# Patient Record
Sex: Female | Born: 1940 | Race: White | Hispanic: No | Marital: Single | Smoking: Never smoker
Health system: Southern US, Community
[De-identification: ages and names within clinical notes are randomized; demographics above are authoritative.]

## PROBLEM LIST (undated history)

## (undated) DIAGNOSIS — R259 Unspecified abnormal involuntary movements: Secondary | ICD-10-CM

## (undated) DIAGNOSIS — M1990 Unspecified osteoarthritis, unspecified site: Secondary | ICD-10-CM

## (undated) DIAGNOSIS — F329 Major depressive disorder, single episode, unspecified: Secondary | ICD-10-CM

## (undated) DIAGNOSIS — K219 Gastro-esophageal reflux disease without esophagitis: Secondary | ICD-10-CM

## (undated) DIAGNOSIS — I10 Essential (primary) hypertension: Secondary | ICD-10-CM

## (undated) DIAGNOSIS — G2581 Restless legs syndrome: Secondary | ICD-10-CM

## (undated) DIAGNOSIS — G20A1 Parkinson's disease without dyskinesia, without mention of fluctuations: Secondary | ICD-10-CM

## (undated) DIAGNOSIS — G252 Other specified forms of tremor: Secondary | ICD-10-CM

## (undated) DIAGNOSIS — K5790 Diverticulosis of intestine, part unspecified, without perforation or abscess without bleeding: Secondary | ICD-10-CM

## (undated) DIAGNOSIS — G20 Parkinson's disease: Secondary | ICD-10-CM

## (undated) DIAGNOSIS — F32A Depression, unspecified: Secondary | ICD-10-CM

## (undated) DIAGNOSIS — M353 Polymyalgia rheumatica: Secondary | ICD-10-CM

## (undated) DIAGNOSIS — E785 Hyperlipidemia, unspecified: Secondary | ICD-10-CM

## (undated) DIAGNOSIS — E669 Obesity, unspecified: Secondary | ICD-10-CM

## (undated) HISTORY — DX: Parkinson's disease without dyskinesia, without mention of fluctuations: G20.A1

## (undated) HISTORY — DX: Restless legs syndrome: G25.81

## (undated) HISTORY — DX: Hyperlipidemia, unspecified: E78.5

## (undated) HISTORY — DX: Essential (primary) hypertension: I10

## (undated) HISTORY — DX: Parkinson's disease: G20

## (undated) HISTORY — PX: SALPINGOOPHORECTOMY: SHX82

## (undated) HISTORY — DX: Gastro-esophageal reflux disease without esophagitis: K21.9

## (undated) HISTORY — PX: TONSILLECTOMY AND ADENOIDECTOMY: SHX28

## (undated) HISTORY — DX: Polymyalgia rheumatica: M35.3

## (undated) HISTORY — DX: Unspecified osteoarthritis, unspecified site: M19.90

## (undated) HISTORY — PX: ROTATOR CUFF REPAIR: SHX139

## (undated) HISTORY — DX: Unspecified abnormal involuntary movements: R25.9

## (undated) HISTORY — PX: CHOLECYSTECTOMY: SHX55

## (undated) HISTORY — DX: Major depressive disorder, single episode, unspecified: F32.9

## (undated) HISTORY — DX: Obesity, unspecified: E66.9

## (undated) HISTORY — PX: VAGINAL HYSTERECTOMY: SUR661

## (undated) HISTORY — DX: Other specified forms of tremor: G25.2

## (undated) HISTORY — DX: Diverticulosis of intestine, part unspecified, without perforation or abscess without bleeding: K57.90

## (undated) HISTORY — DX: Depression, unspecified: F32.A

---

## 1999-01-02 ENCOUNTER — Emergency Department (HOSPITAL_COMMUNITY): Admission: EM | Admit: 1999-01-02 | Discharge: 1999-01-02 | Payer: Self-pay | Admitting: Emergency Medicine

## 1999-01-02 ENCOUNTER — Encounter: Payer: Self-pay | Admitting: Emergency Medicine

## 2002-03-05 ENCOUNTER — Encounter: Payer: Self-pay | Admitting: Gastroenterology

## 2002-04-19 ENCOUNTER — Encounter: Payer: Self-pay | Admitting: Orthopedic Surgery

## 2002-04-23 ENCOUNTER — Observation Stay (HOSPITAL_COMMUNITY): Admission: RE | Admit: 2002-04-23 | Discharge: 2002-04-24 | Payer: Self-pay | Admitting: Orthopedic Surgery

## 2004-03-12 ENCOUNTER — Emergency Department (HOSPITAL_COMMUNITY): Admission: AD | Admit: 2004-03-12 | Discharge: 2004-03-12 | Payer: Self-pay | Admitting: Family Medicine

## 2005-02-12 ENCOUNTER — Ambulatory Visit: Payer: Self-pay | Admitting: Family Medicine

## 2005-02-22 ENCOUNTER — Ambulatory Visit: Payer: Self-pay | Admitting: Family Medicine

## 2005-02-25 ENCOUNTER — Ambulatory Visit: Payer: Self-pay

## 2005-02-27 ENCOUNTER — Ambulatory Visit: Payer: Self-pay | Admitting: *Deleted

## 2005-02-27 ENCOUNTER — Ambulatory Visit (HOSPITAL_COMMUNITY): Admission: RE | Admit: 2005-02-27 | Discharge: 2005-02-27 | Payer: Self-pay | Admitting: *Deleted

## 2005-03-06 ENCOUNTER — Ambulatory Visit: Payer: Self-pay | Admitting: Internal Medicine

## 2005-03-12 ENCOUNTER — Ambulatory Visit: Payer: Self-pay | Admitting: Cardiology

## 2005-03-18 ENCOUNTER — Ambulatory Visit: Payer: Self-pay | Admitting: Family Medicine

## 2005-05-07 ENCOUNTER — Ambulatory Visit: Payer: Self-pay | Admitting: Family Medicine

## 2005-08-20 ENCOUNTER — Ambulatory Visit: Payer: Self-pay | Admitting: Family Medicine

## 2005-10-09 ENCOUNTER — Ambulatory Visit: Payer: Self-pay | Admitting: Family Medicine

## 2005-12-05 ENCOUNTER — Ambulatory Visit: Payer: Self-pay | Admitting: Family Medicine

## 2005-12-09 HISTORY — PX: REPLACEMENT TOTAL KNEE: SUR1224

## 2006-06-17 ENCOUNTER — Ambulatory Visit: Payer: Self-pay | Admitting: Family Medicine

## 2006-11-17 ENCOUNTER — Ambulatory Visit: Payer: Self-pay | Admitting: Family Medicine

## 2006-11-20 ENCOUNTER — Ambulatory Visit: Payer: Self-pay | Admitting: Family Medicine

## 2006-11-20 LAB — CONVERTED CEMR LAB
ALT: 24 units/L (ref 0–40)
AST: 26 units/L (ref 0–37)
Albumin: 3.9 g/dL (ref 3.5–5.2)
Alkaline Phosphatase: 93 units/L (ref 39–117)
BUN: 19 mg/dL (ref 6–23)
Basophils Absolute: 0 10*3/uL (ref 0.0–0.1)
Basophils Relative: 0.5 % (ref 0.0–1.0)
CO2: 29 meq/L (ref 19–32)
Calcium: 9.1 mg/dL (ref 8.4–10.5)
Chloride: 106 meq/L (ref 96–112)
Chol/HDL Ratio, serum: 3.1
Cholesterol: 201 mg/dL (ref 0–200)
Creatinine, Ser: 0.8 mg/dL (ref 0.4–1.2)
Eosinophil percent: 2.1 % (ref 0.0–5.0)
GFR calc non Af Amer: 76 mL/min
Glomerular Filtration Rate, Af Am: 92 mL/min/{1.73_m2}
Glucose, Bld: 124 mg/dL — ABNORMAL HIGH (ref 70–99)
HCT: 37.4 % (ref 36.0–46.0)
HDL: 64.9 mg/dL (ref >39.0)
Hemoglobin: 12.6 g/dL (ref 12.0–15.0)
Hgb A1c MFr Bld: 6.1 % — ABNORMAL HIGH (ref 4.6–6.0)
LDL DIRECT: 119.1 mg/dL
Lymphocytes Relative: 29.2 % (ref 12.0–46.0)
MCHC: 33.6 g/dL (ref 30.0–36.0)
MCV: 89.2 fL (ref 78.0–100.0)
Monocytes Absolute: 0.6 10*3/uL (ref 0.2–0.7)
Monocytes Relative: 8 % (ref 3.0–11.0)
Neutro Abs: 4.6 10*3/uL (ref 1.4–7.7)
Neutrophils Relative %: 60.2 % (ref 43.0–77.0)
Platelets: 294 10*3/uL (ref 150–400)
Potassium: 4 meq/L (ref 3.5–5.1)
RBC: 4.2 M/uL (ref 3.87–5.11)
RDW: 13.4 % (ref 11.5–14.6)
Sodium: 142 meq/L (ref 135–145)
TSH: 0.73 microintl units/mL (ref 0.35–5.50)
Total Bilirubin: 0.7 mg/dL (ref 0.3–1.2)
Total Protein: 7 g/dL (ref 6.0–8.3)
Triglyceride fasting, serum: 97 mg/dL (ref 0–149)
VLDL: 19 mg/dL (ref 0–40)
WBC: 7.6 10*3/uL (ref 4.5–10.5)

## 2007-03-10 HISTORY — PX: KNEE SURGERY: SHX244

## 2007-04-08 ENCOUNTER — Ambulatory Visit (HOSPITAL_BASED_OUTPATIENT_CLINIC_OR_DEPARTMENT_OTHER): Admission: RE | Admit: 2007-04-08 | Discharge: 2007-04-08 | Payer: Self-pay | Admitting: Orthopedic Surgery

## 2007-08-21 DIAGNOSIS — K219 Gastro-esophageal reflux disease without esophagitis: Secondary | ICD-10-CM | POA: Insufficient documentation

## 2007-08-21 DIAGNOSIS — I10 Essential (primary) hypertension: Secondary | ICD-10-CM

## 2007-08-21 DIAGNOSIS — J309 Allergic rhinitis, unspecified: Secondary | ICD-10-CM

## 2007-12-21 ENCOUNTER — Ambulatory Visit: Payer: Self-pay | Admitting: Family Medicine

## 2007-12-21 DIAGNOSIS — M1990 Unspecified osteoarthritis, unspecified site: Secondary | ICD-10-CM | POA: Insufficient documentation

## 2007-12-21 DIAGNOSIS — Z8679 Personal history of other diseases of the circulatory system: Secondary | ICD-10-CM | POA: Insufficient documentation

## 2007-12-21 DIAGNOSIS — F418 Other specified anxiety disorders: Secondary | ICD-10-CM

## 2007-12-23 ENCOUNTER — Encounter: Payer: Self-pay | Admitting: Family Medicine

## 2007-12-23 ENCOUNTER — Ambulatory Visit: Payer: Self-pay | Admitting: Internal Medicine

## 2007-12-30 ENCOUNTER — Telehealth (INDEPENDENT_AMBULATORY_CARE_PROVIDER_SITE_OTHER): Payer: Self-pay | Admitting: *Deleted

## 2008-01-01 ENCOUNTER — Encounter: Payer: Self-pay | Admitting: Family Medicine

## 2008-01-13 ENCOUNTER — Encounter: Payer: Self-pay | Admitting: Family Medicine

## 2008-01-15 ENCOUNTER — Telehealth: Payer: Self-pay | Admitting: Family Medicine

## 2008-01-20 ENCOUNTER — Ambulatory Visit: Payer: Self-pay | Admitting: Family Medicine

## 2008-03-30 ENCOUNTER — Encounter: Payer: Self-pay | Admitting: Family Medicine

## 2008-04-12 ENCOUNTER — Ambulatory Visit: Payer: Self-pay | Admitting: Family Medicine

## 2008-04-12 DIAGNOSIS — M25569 Pain in unspecified knee: Secondary | ICD-10-CM | POA: Insufficient documentation

## 2008-05-06 ENCOUNTER — Inpatient Hospital Stay (HOSPITAL_COMMUNITY): Admission: RE | Admit: 2008-05-06 | Discharge: 2008-05-09 | Payer: Self-pay | Admitting: Orthopedic Surgery

## 2008-08-02 ENCOUNTER — Encounter: Payer: Self-pay | Admitting: Family Medicine

## 2008-09-16 ENCOUNTER — Ambulatory Visit: Payer: Self-pay | Admitting: Family Medicine

## 2008-11-19 ENCOUNTER — Encounter: Payer: Self-pay | Admitting: Family Medicine

## 2009-02-23 ENCOUNTER — Ambulatory Visit: Payer: Self-pay | Admitting: Family Medicine

## 2009-02-24 ENCOUNTER — Telehealth: Payer: Self-pay | Admitting: Family Medicine

## 2009-03-08 ENCOUNTER — Ambulatory Visit: Payer: Self-pay

## 2009-03-08 ENCOUNTER — Encounter: Payer: Self-pay | Admitting: Family Medicine

## 2009-03-13 ENCOUNTER — Ambulatory Visit: Payer: Self-pay | Admitting: Gastroenterology

## 2009-03-20 ENCOUNTER — Telehealth: Payer: Self-pay | Admitting: Gastroenterology

## 2009-03-27 ENCOUNTER — Encounter: Payer: Self-pay | Admitting: Gastroenterology

## 2009-03-27 ENCOUNTER — Ambulatory Visit: Payer: Self-pay | Admitting: Gastroenterology

## 2009-03-27 HISTORY — PX: COLONOSCOPY: SHX174

## 2009-03-29 ENCOUNTER — Encounter: Payer: Self-pay | Admitting: Gastroenterology

## 2009-04-18 ENCOUNTER — Telehealth (INDEPENDENT_AMBULATORY_CARE_PROVIDER_SITE_OTHER): Payer: Self-pay

## 2009-04-19 ENCOUNTER — Ambulatory Visit: Payer: Self-pay | Admitting: Gastroenterology

## 2009-04-19 DIAGNOSIS — K5900 Constipation, unspecified: Secondary | ICD-10-CM | POA: Insufficient documentation

## 2009-06-21 ENCOUNTER — Encounter: Payer: Self-pay | Admitting: Family Medicine

## 2009-06-26 ENCOUNTER — Ambulatory Visit: Payer: Self-pay | Admitting: Gastroenterology

## 2009-06-27 ENCOUNTER — Encounter: Payer: Self-pay | Admitting: Gastroenterology

## 2009-10-04 ENCOUNTER — Encounter (INDEPENDENT_AMBULATORY_CARE_PROVIDER_SITE_OTHER): Payer: Self-pay | Admitting: *Deleted

## 2009-11-07 ENCOUNTER — Ambulatory Visit: Payer: Self-pay | Admitting: Family Medicine

## 2010-01-01 ENCOUNTER — Ambulatory Visit: Payer: Self-pay | Admitting: Family Medicine

## 2010-01-05 LAB — CONVERTED CEMR LAB
ALT: 20 units/L (ref 0–35)
AST: 23 units/L (ref 0–37)
Albumin: 3.9 g/dL (ref 3.5–5.2)
Alkaline Phosphatase: 81 units/L (ref 39–117)
BUN: 17 mg/dL (ref 6–23)
Basophils Absolute: 0.1 10*3/uL (ref 0.0–0.1)
Basophils Relative: 0.9 % (ref 0.0–3.0)
Bilirubin, Direct: 0.1 mg/dL (ref 0.0–0.3)
CO2: 32 meq/L (ref 19–32)
Calcium: 9.3 mg/dL (ref 8.4–10.5)
Chloride: 104 meq/L (ref 96–112)
Cholesterol: 211 mg/dL — ABNORMAL HIGH (ref 0–200)
Creatinine, Ser: 0.8 mg/dL (ref 0.4–1.2)
Direct LDL: 127.5 mg/dL
Eosinophils Absolute: 0.1 10*3/uL (ref 0.0–0.7)
Eosinophils Relative: 1.4 % (ref 0.0–5.0)
GFR calc non Af Amer: 75.4 mL/min (ref >60)
Glucose, Bld: 101 mg/dL — ABNORMAL HIGH (ref 70–99)
HCT: 34.9 % — ABNORMAL LOW (ref 36.0–46.0)
HDL: 66.7 mg/dL (ref >39.00)
Hemoglobin: 11.7 g/dL — ABNORMAL LOW (ref 12.0–15.0)
Lymphocytes Relative: 34.7 % (ref 12.0–46.0)
Lymphs Abs: 2.1 10*3/uL (ref 0.7–4.0)
MCHC: 33.5 g/dL (ref 30.0–36.0)
MCV: 88.3 fL (ref 78.0–100.0)
Monocytes Absolute: 0.4 10*3/uL (ref 0.1–1.0)
Monocytes Relative: 6.7 % (ref 3.0–12.0)
Neutro Abs: 3.3 10*3/uL (ref 1.4–7.7)
Neutrophils Relative %: 56.3 % (ref 43.0–77.0)
Platelets: 215 10*3/uL (ref 150.0–400.0)
Potassium: 4.2 meq/L (ref 3.5–5.1)
RBC: 3.95 M/uL (ref 3.87–5.11)
RDW: 13.3 % (ref 11.5–14.6)
Sodium: 141 meq/L (ref 135–145)
TSH: 0.83 microintl units/mL (ref 0.35–5.50)
Total Bilirubin: 0.8 mg/dL (ref 0.3–1.2)
Total CHOL/HDL Ratio: 3
Total Protein: 6.8 g/dL (ref 6.0–8.3)
Triglycerides: 100 mg/dL (ref 0.0–149.0)
VLDL: 20 mg/dL (ref 0.0–40.0)
Vitamin B-12: 375 pg/mL (ref 211–911)
WBC: 6 10*3/uL (ref 4.5–10.5)

## 2010-01-31 ENCOUNTER — Encounter: Payer: Self-pay | Admitting: Family Medicine

## 2010-03-18 HISTORY — PX: ESOPHAGOGASTRODUODENOSCOPY (EGD) WITH ESOPHAGEAL DILATION: SHX5812

## 2010-10-05 ENCOUNTER — Telehealth: Payer: Self-pay | Admitting: Gastroenterology

## 2010-10-10 ENCOUNTER — Ambulatory Visit: Payer: Self-pay | Admitting: Gastroenterology

## 2010-10-10 DIAGNOSIS — R1319 Other dysphagia: Secondary | ICD-10-CM | POA: Insufficient documentation

## 2010-10-12 ENCOUNTER — Ambulatory Visit: Payer: Self-pay | Admitting: Gastroenterology

## 2010-10-15 ENCOUNTER — Encounter: Payer: Self-pay | Admitting: Gastroenterology

## 2010-10-16 ENCOUNTER — Encounter: Payer: Self-pay | Admitting: Gastroenterology

## 2010-11-14 ENCOUNTER — Encounter: Payer: Self-pay | Admitting: Family Medicine

## 2010-12-03 ENCOUNTER — Ambulatory Visit (HOSPITAL_COMMUNITY)
Admission: RE | Admit: 2010-12-03 | Discharge: 2010-12-03 | Payer: Self-pay | Source: Home / Self Care | Attending: Orthopedic Surgery | Admitting: Orthopedic Surgery

## 2011-01-05 IMAGING — CR DG CHEST 2V
2 series · 2 of 2 positions shown · non-contrast
Comparison: 05/04/2008

CLINICAL DATA: Preadmit knee surgery

CHEST - 2 VIEW

[view not recorded (1 of 2)]
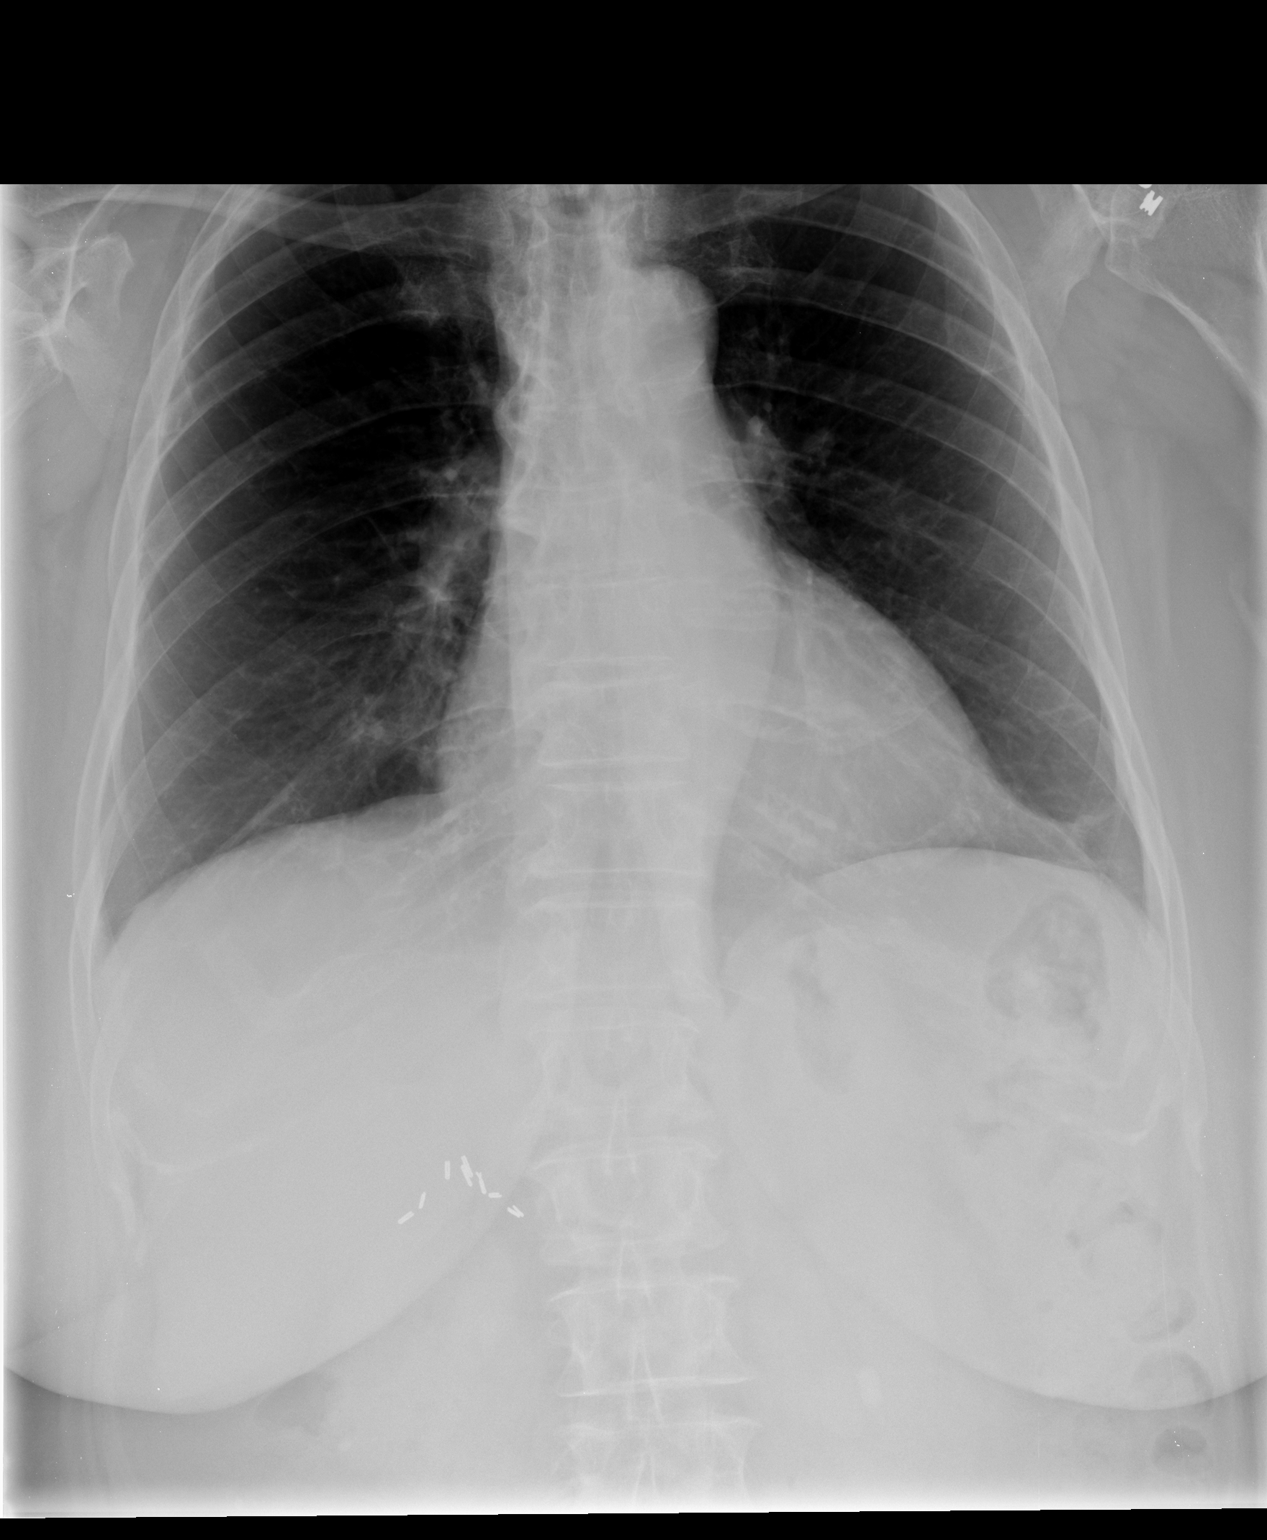

[view not recorded (2 of 2)]
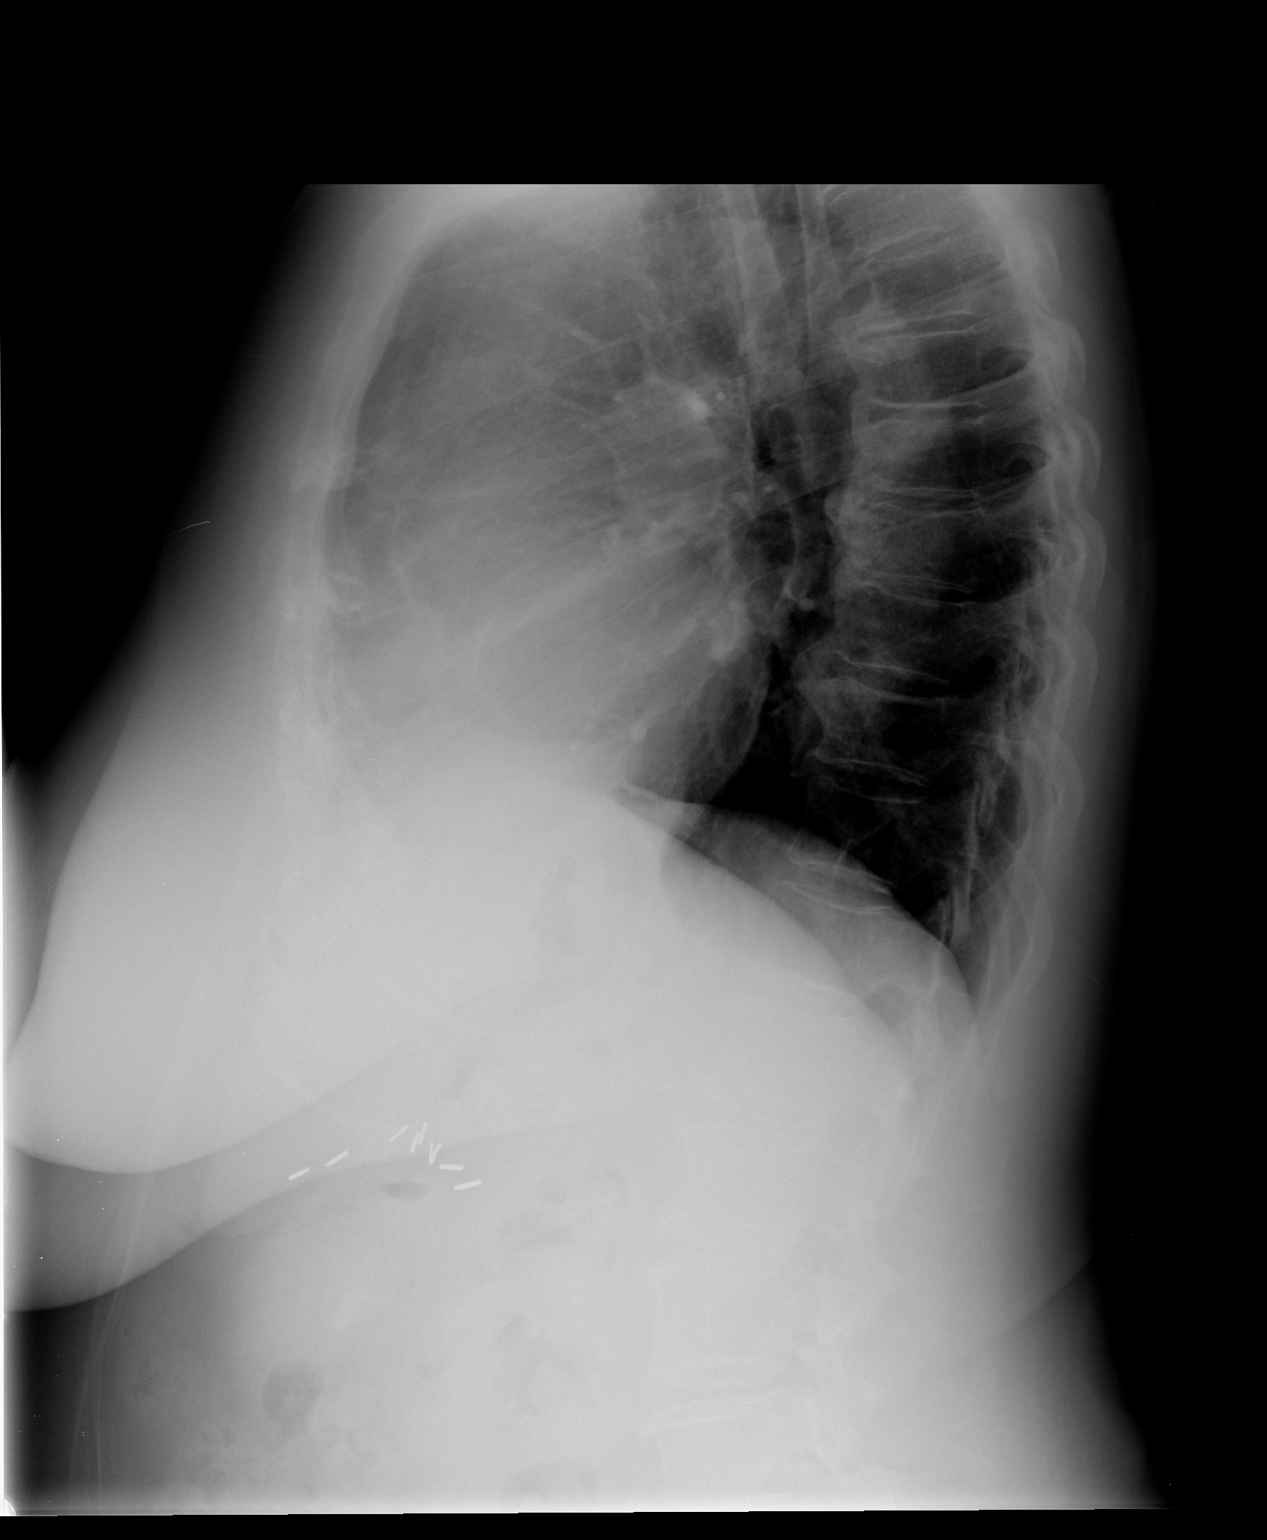

[2 of 2 positions shown; findings below may reference images not displayed]

FINDINGS: Heart size is upper normal.  Negative for heart failure.
Lungs are clear without infiltrate or mass lesion.  Mild scarring
in the lingula.  Mild degenerative change and spurring in the
thoracic spine.
IMPRESSION: No active cardiopulmonary disease.

## 2011-01-08 NOTE — Miscellaneous (Signed)
Summary: Dexilant rx  Clinical Lists Changes  Medications: Rx of DEXILANT 60 MG CPDR (DEXLANSOPRAZOLE) one capsule by mouth once daily;  #30 x 6;  Signed;  Entered by: Harlow Mares CMA (AAMA);  Authorized by: Meryl Dare MD [facility];  Method used: [facility]  [address] [id number] [address], [place], [place]  [address], Ph: [phone] or [phone], Fax: [phone]  Left a message on patients machine to call back if she has questions but her rx has been sent.   Prescriptions: DEXILANT 60 MG CPDR (DEXLANSOPRAZOLE) one capsule by mouth once daily  #30 x 6   Entered by:   Harlow Mares CMA (AAMA)   Authorized by:   Meryl Dare MD [facility]   Signed by:   Harlow Mares CMA (AAMA) on 10/15/2010   Method used:   [facility]        [facility]  [address] [id number]       [address]       [place], [place]  [address]       Ph: [phone] or [phone]       Fax: [phone]   RxID:   [id number]

## 2011-01-08 NOTE — Assessment & Plan Note (Signed)
Summary: pt will come in fasting/njr   Vital Signs:  Patient profile:   70 year old female Menstrual status:  hysterectomy Height:      62.75 inches Weight:      186 pounds Temp:     98.0 degrees F oral Pulse rate:   68 / minute BP sitting:   136 / 82  (right arm)  Vitals Entered By: Alfred Levins, CMA (January 01, 2010 9:58 AM)  Contraindications/Deferment of Procedures/Staging:    Test/Procedure: FLU VAX    Reason for deferment: patient declined  CC: cpx, fasting     Menstrual Status hysterectomy   History of Present Illness: 70 yr old female for cpx. She has a few items to discuss today. First she has frequent nasal stuffiness and PND. No sinus pain or fever or ST. She also has been experiencing occasioanl brief sharp pains or "shocks" in different parts of her body which are new. These occur only a few times a day and do not inhibit her activites at all.   Current Medications (verified): 1)  Lisinopril-Hydrochlorothiazide 20-25 Mg Tabs (Lisinopril-Hydrochlorothiazide) .Marland Kitchen.. 1 By Mouth Once Daily 2)  Cherry Fruit  Extract .... 2 By Mouth Once Daily 3)  Calcet 150-100 Mg-Unit  Tabs (Calcium-Vitamin D) .Marland Kitchen.. 1 By Mouth Once Daily 4)  Fish Oil 500 Mg  Caps (Omega-3 Fatty Acids) .... 2 By Mouth Once Daily 5)  Multivitamins   Tabs (Multiple Vitamin) .Marland Kitchen.. 1 By Mouth Once Daily 6)  Tramadol Hcl 50 Mg Tabs (Tramadol Hcl) .... Take 1 Tabs  Every Morning and 1 Tab Every Evening 7)  Nexium 40 Mg Cpdr (Esomeprazole Magnesium) .... Take 1 Tab Each Morning 8)  Vitamin D 2000 Unit Tabs (Cholecalciferol) .... Once Daily  Allergies (verified): No Known Drug Allergies  Past History:  Past Medical History: Allergic rhinitis GERD, sees Dr. Russella Dar Hypertension Depression Osteoarthritis and polymyalgia rheumatica  per Dr. Corliss Skains Hyperlipidemia Diverticulosis Obesity Sleep Apnea  Past Surgical History: Cholecystectomy Tonsillectomy and adenoidectomy surgery to left knee for torn  ligaments 4-08  per Dr. Sherlean Foot colonoscopy 03-27-09  per Dr. 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 Sherlean Foot Hysterectomy Bilateral salpingo-oophorectomy Rotator Cuff Repair  Family History: Reviewed history from 06/26/2009 and no changes required. Family History of Alcoholism/Addiction Alzheimer's disease mother Family History Hypertension Family History of Stroke M 1st degree relative <50 No FH of Colon Cancer: Family History of Colon Polyps: mother  Social History: Reviewed history from 04/19/2009 and no changes required. Divorced Never Smoked Alcohol use-no Drug use-no Daily Caffeine Use -2 Occupation: PT Retail  Review of Systems  The patient denies anorexia, fever, weight loss, weight gain, vision loss, decreased hearing, hoarseness, chest pain, syncope, dyspnea on exertion, peripheral edema, prolonged cough, headaches, hemoptysis, abdominal pain, melena, hematochezia, severe indigestion/heartburn, hematuria, incontinence, genital sores, muscle weakness, suspicious skin lesions, transient blindness, difficulty walking, depression, unusual weight change, abnormal bleeding, enlarged lymph nodes, angioedema, and breast masses.    Physical Exam  General:  overweight-appearing.   Head:  Normocephalic and atraumatic without obvious abnormalities. No apparent alopecia or balding. Eyes:  No corneal or conjunctival inflammation noted. EOMI. Perrla. Funduscopic exam benign, without hemorrhages, exudates or papilledema. Vision grossly normal. Ears:  External ear exam shows no significant lesions or deformities.  Otoscopic examination reveals clear canals, tympanic membranes are intact bilaterally without bulging, retraction, inflammation or discharge. Hearing is grossly normal bilaterally. Nose:  External nasal examination shows no deformity or inflammation. Nasal mucosa are pink  and moist without lesions or exudates. Mouth:  Oral mucosa and oropharynx without  lesions or exudates.  Teeth in good repair. Neck:  No deformities, masses, or tenderness noted. Chest Wall:  No deformities, masses, or tenderness noted. Breasts:  No mass, nodules, thickening, tenderness, bulging, retraction, inflamation, nipple discharge or skin changes noted.   Lungs:  Normal respiratory effort, chest expands symmetrically. Lungs are clear to auscultation, no crackles or wheezes. Heart:  Normal rate and regular rhythm. S1 and S2 normal without gallop, murmur, click, rub or other extra sounds. EKG normal Abdomen:  Bowel sounds positive,abdomen soft and non-tender without masses, organomegaly or hernias noted. Msk:  No deformity or scoliosis noted of thoracic or lumbar spine.   Pulses:  R and L carotid,radial,femoral,dorsalis pedis and posterior tibial pulses are full and equal bilaterally Extremities:  No clubbing, cyanosis, edema, or deformity noted with normal full range of motion of all joints.   Neurologic:  No cranial nerve deficits noted. Station and gait are normal. Plantar reflexes are down-going bilaterally. DTRs are symmetrical throughout. Sensory, motor and coordinative functions appear intact. Skin:  Intact without suspicious lesions or rashes Cervical Nodes:  No lymphadenopathy noted Axillary Nodes:  No palpable lymphadenopathy Inguinal Nodes:  No significant adenopathy Psych:  Cognition and judgment appear intact. Alert and cooperative with normal attention span and concentration. No apparent delusions, illusions, hallucinations   Impression & Recommendations:  Problem # 1:  HEALTH MAINTENANCE EXAM (ICD-V70.0)  Orders: EKG w/ Interpretation ([address]) UA Dipstick w/o Micro (automated)  (81003) Venipuncture ([address]) TLB-Lipid Panel (80061-LIPID) TLB-BMP (Basic Metabolic Panel-BMET) (80048-METABOL) TLB-CBC Platelet - w/Differential (85025-CBCD) TLB-Hepatic/Liver Function Pnl (80076-HEPATIC) TLB-TSH (Thyroid Stimulating Hormone) (84443-TSH) TLB-B12,  Serum-Total ONLY (60454-U98)  Complete Medication List: 1)  Lisinopril-hydrochlorothiazide 20-25 Mg Tabs (Lisinopril-hydrochlorothiazide) .Marland Kitchen.. 1 by mouth once daily 2)  Cherry Fruit Extract  .... 2 by mouth once daily 3)  Calcet 150-100 Mg-unit Tabs (Calcium-vitamin d) .Marland Kitchen.. 1 by mouth once daily 4)  Fish Oil 500 Mg Caps (Omega-3 fatty acids) .... 2 by mouth once daily 5)  Multivitamins Tabs (Multiple vitamin) .Marland Kitchen.. 1 by mouth once daily 6)  Tramadol Hcl 50 Mg Tabs (Tramadol hcl) .... Take 1 tabs  every morning and 1 tab every evening 7)  Nexium 40 Mg Cpdr (Esomeprazole magnesium) .... Take 1 tab each morning 8)  Vitamin D 2000 Unit Tabs (Cholecalciferol) .... Once daily 9)  Flonase 50 Mcg/act Susp (Fluticasone propionate) .... 2 sprays each nostril once daily  Patient Instructions: 1)  Get labs. 2)  It is important that you exercise reguarly at least 20 minutes 5 times a week. If you develop chest pain, have severe difficulty breathing, or feel very tired, stop exercising immediately and seek medical attention.  3)  You need to lose weight. Consider a lower calorie diet and regular exercise.  4)  Schedule your mammogram.  Prescriptions: FLONASE 50 MCG/ACT SUSP (FLUTICASONE PROPIONATE) 2 sprays each nostril once daily  #30 x 11   Entered and Authorized by:   Nelwyn Salisbury MD   Signed by:   Nelwyn Salisbury MD on 01/01/2010   Method used:   [facility]  [address] [id number]       [address]       [place], [place]  [address]       Ph: [phone] or [phone]       Fax: [phone]   RxID:  [id number] LISINOPRIL-HYDROCHLOROTHIAZIDE 20-25 MG TABS (LISINOPRIL-HYDROCHLOROTHIAZIDE) 1 by mouth once daily  #30 x 11   Entered and Authorized by:   Nelwyn Salisbury MD   Signed by:   Nelwyn Salisbury MD on 01/01/2010   Method used:   [facility]  [address] [id number]       [address]       [place], [place]  [address]       Ph: [phone] or [phone]       Fax: [phone]   RxID:   [id number]   Preventive Care Screening  Mammogram:    Date:  12/10/2007    Results:  normal    Appended Document: pt will come in fasting/njr  Laboratory Results   Urine Tests    Routine Urinalysis   Color: yellow Appearance: Clear Glucose: negative   (Normal Range: Negative) Bilirubin: negative   (Normal Range: Negative) Ketone: negative   (Normal Range: Negative) Spec. Gravity: 1.020   (Normal Range: 1.003-1.035) Blood: negative   (Normal Range: Negative) pH: 7.0   (Normal Range: 5.0-8.0) Protein: negative   (Normal Range: Negative) Urobilinogen: 0.2   (Normal Range: 0-1) Nitrite: negative   (Normal Range: Negative) Leukocyte Esterace: trace   (Normal Range: Negative)    Comments: Rita Ohara  January 01, 2010 1:36 PM

## 2011-01-08 NOTE — Progress Notes (Signed)
Summary: Triage  Phone Note Call from Patient Call back at Home Phone [phone]   Caller: Patient Call For: Dr. Russella Dar Summary of Call: Wakes up nauseated, burning/abd pains, "gurgling sounds" Initial call taken by: Karna Christmas,  October 05, 2010 1:41 PM  Follow-up for Phone Call        Patient c/o nausea, gas, excess belching, abdominal burning.  She tried taking Nexium but this didn't help.  Advised to begin a low gas diet.  She is asked to resume Nexium.  She is asked to try gasa-x or phazyme ac meals.  I have given her 2 boxes of Align and she will try this until her appointment on Wed.  I have provided her written information on gas and a low gas diet. Follow-up by: Darcey Nora RN, CGRN,  October 05, 2010 3:49 PM

## 2011-01-08 NOTE — Assessment & Plan Note (Signed)
Summary: gas/ nausea/ abdominal pain/sheri   History of Present Illness Visit Type: Follow-up Visit Primary GI MD: Elie Goody MD [facility] Primary Roma Bierlein: Gershon Crane, MD Chief Complaint: gas, nausea, dysphagia, epigastirc pain x 1 month History of Present Illness:   Mrs. Wimer, relates problems with epigastric pain, burning chest pain, worsening belching, and nausea. She does not take acid suppressing meds on a regular basis, but started Nexium last month, which improved her symptoms, but she has since discontinued Nexium. She also notes difficulty swallowing solid foods intermittently for the past few months.   GI Review of Systems    Reports abdominal pain, acid reflux, belching, bloating, chest pain, dysphagia with solids, and  nausea.     Location of  Abdominal pain: epigastric area.    Denies dysphagia with liquids, heartburn, loss of appetite, vomiting, vomiting blood, weight loss, and  weight gain.        Denies anal fissure, black tarry stools, change in bowel habit, constipation, diarrhea, diverticulosis, fecal incontinence, heme positive stool, hemorrhoids, irritable bowel syndrome, jaundice, light color stool, liver problems, rectal bleeding, and  rectal pain.   Current Medications (verified): 1)  Lisinopril-Hydrochlorothiazide 20-25 Mg Tabs (Lisinopril-Hydrochlorothiazide) .Marland Kitchen.. 1 By Mouth Once Daily 2)  Cherry Fruit  Extract .... 2 By Mouth Once Daily 3)  Calcet 150-100 Mg-Unit  Tabs (Calcium-Vitamin D) .Marland Kitchen.. 1 By Mouth Once Daily 4)  Fish Oil 500 Mg  Caps (Omega-3 Fatty Acids) .... 2 By Mouth Once Daily 5)  Multivitamins   Tabs (Multiple Vitamin) .Marland Kitchen.. 1 By Mouth Once Daily 6)  Tramadol Hcl 50 Mg Tabs (Tramadol Hcl) .... Take 1 Tabs  Every Morning and 1 Tab Every Evening 7)  Vitamin D 2000 Unit Tabs (Cholecalciferol) .... Once Daily 8)  Flonase 50 Mcg/act Susp (Fluticasone Propionate) .... 2 Sprays Each Nostril Once Daily  Allergies (verified): No Known Drug  Allergies  Past History:  Past Medical History: Reviewed history from 01/01/2010 and no changes required. Allergic rhinitis GERD, sees Dr. Russella Dar Hypertension Depression Osteoarthritis and polymyalgia rheumatica  per Dr. Corliss Skains Hyperlipidemia Diverticulosis Obesity Sleep Apnea  Past Surgical History: Reviewed history from 01/01/2010 and no changes required. Cholecystectomy Tonsillectomy and adenoidectomy surgery to left knee for torn ligaments 4-08  per Dr. Sherlean Foot colonoscopy 03-27-09  per Dr. 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 Sherlean Foot Hysterectomy Bilateral salpingo-oophorectomy Rotator Cuff Repair  Family History: Reviewed history from 06/26/2009 and no changes required. Family History of Alcoholism/Addiction Alzheimer's disease mother Family History Hypertension Family History of Stroke M 1st degree relative <50 No FH of Colon Cancer: Family History of Colon Polyps: mother  Social History: Reviewed history from 04/19/2009 and no changes required. Divorced Never Smoked Alcohol use-no Drug use-no Daily Caffeine Use -2 Occupation: PT Retail  Review of Systems       The patient complains of allergy/sinus, arthritis/joint pain, cough, muscle pains/cramps, sleeping problems, urination - excessive, urine leakage, and voice change.  The patient denies anemia, anxiety-new, back pain, blood in urine, breast changes/lumps, change in vision, confusion, coughing up blood, depression-new, fainting, fatigue, fever, headaches-new, hearing problems, heart murmur, heart rhythm changes, itching, menstrual pain, night sweats, nosebleeds, pregnancy symptoms, shortness of breath, skin rash, sore throat, swelling of feet/legs, swollen lymph glands, thirst - excessive , urination - excessive , urination changes/pain, and vision changes.    Vital Signs:  Patient profile:   70 year old female Menstrual status:  hysterectomy Height:      62.75  inches Weight:      192.13 pounds BMI:     34.43 Pulse rate:   68 / minute Pulse rhythm:   regular BP sitting:   134 / 72  (left arm) Cuff size:   regular  Vitals Entered By: June McMurray CMA Duncan Dull) (October 10, 2010 9:53 AM)  Physical Exam  General:  Well developed, well nourished, no acute distress. Head:  Normocephalic and atraumatic. Eyes:  PERRLA, no icterus. Mouth:  No deformity or lesions, dentition normal. Lungs:  Clear throughout to auscultation. Heart:  Regular rate and rhythm; no murmurs, rubs,  or bruits. Abdomen:  Soft, nontender and nondistended. No masses, hepatosplenomegaly or hernias noted. Normal bowel sounds. Psych:  Alert and cooperative. Normal mood and affect.  Impression & Recommendations:  Problem # 1:  GERD (ICD-530.81) Refractory reflux symptoms and dysphasia. Despite my prior recommendations she has not remained on acid suppressants. Start Dexilant 60 mg q.a.m. and begin on standard antireflux measures. She has not previously had upper endoscopy. Rule out erosive esophagitis, strictures. The risks, benefits and alternatives to endoscopy with possible biopsy and possible dilation were discussed with the patient and they consent to proceed. The procedure will be scheduled electively.  Problem # 2:  DYSPHAGIA (ICD-787.29) As in problem #1.  Problem # 3:  FAMILY HX OF COLONIC POLYPS (ICD-V18.51) Screening colonoscopy September 2015  Other Orders: EGD SAV (EGD SAV)  Patient Instructions: 1)  Start Dexilant samples one capsule by mouth once daily x 1 month. 2)  Avoid foods high in acid content ( tomatoes, citrus juices, spicy foods) . Avoid eating within 3 to 4 hours of lying down or before exercising. Do not over eat; try smaller more frequent meals. Elevate head of bed four inches when sleeping.  3)  Upper Endoscopy with Dilatation brochure given.  4)  Copy sent to : Gershon Crane, MD 5)  The medication list was reviewed and reconciled.  All changed /  newly prescribed medications were explained.  A complete medication list was provided to the patient / caregiver.

## 2011-01-08 NOTE — Letter (Signed)
Summary: EGD Instructions  [facility]  [address] [place], [place] [address]   Phone: [phone]  Fax: [phone]       Leah Hodges    Jul 01, 1941    MRN: [id number]       Procedure Day /Date: Friday November 4th, 2011     Arrival Time:  3:00pm     Procedure Time: 4:00pm     Location of Procedure:                    _ x _ [facility] (4th Floor)    PREPARATION FOR ENDOSCOPY   On 10/12/10 THE DAY OF THE PROCEDURE:  1.   No solid foods, milk or milk products are allowed after midnight the night before your procedure.  2.   Do not drink anything colored red or purple.  Avoid juices with pulp.  No orange juice.  3.  You may drink clear liquids until 2:00pm, which is 2 hours before your procedure.                                                                                                CLEAR LIQUIDS INCLUDE: Water Jello Ice Popsicles Tea (sugar ok, no milk/cream) Powdered fruit flavored drinks Coffee (sugar ok, no milk/cream) Gatorade Juice: apple, white grape, white cranberry  Lemonade Clear bullion, consomm, broth Carbonated beverages (any kind) Strained chicken noodle soup Hard Candy   MEDICATION INSTRUCTIONS  Unless otherwise instructed, you should take regular prescription medications with a small sip of water as early as possible the morning of your procedure.       OTHER INSTRUCTIONS  You will need a responsible adult at least 71 years of age to accompany you and drive you home.   This person must remain in the waiting room during your procedure.  Wear loose fitting clothing that is easily removed.  Leave jewelry and other valuables at home.  However, you may wish to bring a book to read or an iPod/MP3 player to listen to music as you wait for your procedure to start.  Remove all body piercing jewelry and leave at home.  Total time from sign-in until discharge is approximately 2-3 hours.  You should go home  directly after your procedure and rest.  You can resume normal activities the day after your procedure.  The day of your procedure you should not:   Drive   Make legal decisions   Operate machinery   Drink alcohol   Return to work  You will receive specific instructions about eating, activities and medications before you leave.    The above instructions have been reviewed and explained to me by   Marchelle Folks.     I fully understand and can verbalize these instructions _____________________________ Date _________

## 2011-01-08 NOTE — Procedures (Signed)
Summary: Upper Endoscopy w/DIL  Patient: [name] Note: All result statuses are Final unless otherwise noted.  Tests: (1) Upper Endoscopy w/DIL (UED)  UED Upper Endoscopy w/DIL                             DONE     [facility]     [address]     [place], [place]  [address]           ENDOSCOPY PROCEDURE REPORT     PATIENT:  Leah Hodges, Leah Hodges  MR#:  [id number]     BIRTHDATE:  09-14-1941, 69 yrs. old  GENDER:  female     ENDOSCOPIST:  Judie Petit T. Russella Dar, MD, [facility]           PROCEDURE DATE:  10/12/2010     PROCEDURE:  EGD with dilatation over guidewire, and with biopsy     ASA CLASS:  Class II     INDICATIONS:  1) dysphagia  2) GERD     MEDICATIONS:  Fentanyl 50 mcg IV, Versed 5 mg IV     TOPICAL ANESTHETIC:  Exactacain Spray     DESCRIPTION OF PROCEDURE:   After the risks benefits and     alternatives of the procedure were thoroughly explained, informed     consent was obtained.  The [id number] endoscope was     introduced through the mouth and advanced to the second portion of     the duodenum, without limitations.  The instrument was slowly     withdrawn as the mucosa was carefully examined.     <<PROCEDUREIMAGES>>     A sessile polyp was found in the body of the stomach. It was 5 mm     in size. With standard forceps, a biopsy was obtained and sent to     pathology.  The stomach was entered and closely examined. The     pylorus, antrum, angularis, and lesser curvature were well     visualized, including a retroflexed view of the cardia and fundus.     The stomach wall was normally distensable. The stomach was     otherwise normal. The scope passed easily through the pylorus into     the duodenum.  The esophagus and gastroesophageal junction were     completely normal in appearance. The duodenal bulb was normal in     appearance, as was the postbulbar duodenum. Dilation was then     performed at the total esophagus for dysphagia without a     stricture:          1) Dilator:  Savary over guidewire  Size:  17 mm     Resistance:  none  Heme:  none           COMPLICATIONS:  None           ENDOSCOPIC IMPRESSION:     1) 5 mm sessile polyp in the body of the stomach     RECOMMENDATIONS:     1) await pathology results     2) continue PPI long term for GERD     3) anti-reflux regimen long term     4) post dilation instructions           Girl Schissler T. Russella Dar, MD, [facility]           CC:  Tera Mater. Clent Ridges, M.D.           n.  eSIGNED:   Franziska Podgurski T. Council Munguia at 10/12/2010 04:36 PM           Terrilee Croak, [id number]  Note: An exclamation mark (!) indicates a result that was not dispersed into the flowsheet. Document Creation Date: 10/12/2010 4:36 PM _______________________________________________________________________  (1) Order result status: Final Collection or observation date-time: 10/12/2010 16:31 Requested date-time:  Receipt date-time:  Reported date-time:  Referring Physician:   Ordering Physician: Claudette Head [phone]) Specimen Source:  Source: Launa Grill Order Number: [id number] Lab site:

## 2011-01-08 NOTE — Letter (Signed)
Summary: Patient [facility] Biopsy Results   Gastroenterology  [address] [place], [place] [address]   Phone: [phone]  Fax: [phone]        October 16, 2010 MRN: [id number]    [facility] [address] [place], [place]  [address]    Dear Ms. Longnecker,  I am pleased to inform you that the biopsies taken during your recent endoscopic examination did not show any evidence of cancer upon pathologic examination. The biopsy showed a benign fundic gland polyp.  Continue with the treatment plan as outlined on the day of your      exam.  Please call us if you are having persistent problems or have questions about your condition that have not been fully answered at this time.  Sincerely,  Meryl Dare MD [facility]  This letter has been electronically signed by your physician.  Appended Document: Patient Notice-Endo Biopsy Results letter mailed

## 2011-01-08 NOTE — Letter (Signed)
Summary: [facility]  [facility]   Imported By: Maryln Gottron 02/06/2010 12:38:02  _____________________________________________________________________  External Attachment:    Type:   Image     Comment:   External Document

## 2011-01-10 NOTE — Letter (Signed)
Summary: [facility]  [facility]   Imported By: Maryln Gottron 12/19/2010 13:53:13  _____________________________________________________________________  External Attachment:    Type:   Image     Comment:   External Document

## 2011-02-18 LAB — CBC
Hemoglobin: 13 g/dL (ref 12.0–15.0)
MCH: 29.9 pg (ref 26.0–34.0)
MCHC: 33.9 g/dL (ref 30.0–36.0)

## 2011-02-18 LAB — BASIC METABOLIC PANEL
CO2: 31 mEq/L (ref 19–32)
Calcium: 9.2 mg/dL (ref 8.4–10.5)
GFR calc Af Amer: >60 mL/min (ref >60)
GFR calc non Af Amer: >60 mL/min (ref >60)
Glucose, Bld: 102 mg/dL — ABNORMAL HIGH (ref 70–99)
Potassium: 3.3 mEq/L — ABNORMAL LOW (ref 3.5–5.1)
Sodium: 138 mEq/L (ref 135–145)

## 2011-02-18 LAB — SURGICAL PCR SCREEN: Staphylococcus aureus: NEGATIVE

## 2011-04-03 NOTE — Op Note (Signed)
  NAMEJAEDA, BRUSO              ACCOUNT NO.:  [id number]  MEDICAL RECORD NO.:  [id number]           PATIENT TYPE:  LOCATION:                                 FACILITY:  PHYSICIAN:  Mila Homer. Sherlean Foot, M.D. DATE OF BIRTH:  03-Feb-1941  DATE OF PROCEDURE:  12/05/2010 DATE OF DISCHARGE:                              OPERATIVE REPORT   PREOPERATIVE DIAGNOSIS:  Right knee meniscus tear, osteoarthritis.  POSTOPERATIVE DIAGNOSIS:  Right knee meniscus tear, osteoarthritis  PROCEDURE:  Right knee arthroscopy.  INDICATIONS FOR PROCEDURE:  The patient is a 70 year old white female with mechanical symptoms and arthritis and inability to do this in an out patient setting at the [facility].  DESCRIPTION OF PROCEDURE:  The patient was laid supine, administered general anesthesia.  Right leg was prepped and draped in the usual sterile fashion.  Inferolateral and inferomedial ports were created with a #11 blade blunt trocar and cannula.  Diagnostic arthroscopy revealed chondromalacia of the medial compartment and large meniscus tear. Straight and upbiting basket forceps were used to perform partial medial meniscectomy and chondroplasty.  I then went into the lateral compartment.  It was just a little a bit chondromalacia and  needed frank meniscectomy or chondroplasty.  I then went into the patellofemoral compartment some chondromalacia here as well.  I then lavaged and closed with 4-0 nylon sutures.  Dressed with sponges, sterile Webril and Ace wrap.  COMPLICATIONS:  None.  DRAINS:  None.          ______________________________ Mila Homer. Sherlean Foot, M.D.     SDL/MEDQ  D:  03/24/2011  T:  03/24/2011  Job:  [id number]  Electronically Signed by Georgena Spurling M.D. on 04/03/2011 09:26:51 AM

## 2011-04-11 ENCOUNTER — Encounter: Payer: Self-pay | Admitting: Gastroenterology

## 2011-04-11 ENCOUNTER — Ambulatory Visit (INDEPENDENT_AMBULATORY_CARE_PROVIDER_SITE_OTHER): Payer: Medicare Other | Admitting: Gastroenterology

## 2011-04-11 DIAGNOSIS — R079 Chest pain, unspecified: Secondary | ICD-10-CM

## 2011-04-11 DIAGNOSIS — K219 Gastro-esophageal reflux disease without esophagitis: Secondary | ICD-10-CM

## 2011-04-11 DIAGNOSIS — R1084 Generalized abdominal pain: Secondary | ICD-10-CM

## 2011-04-11 MED ORDER — HYOSCYAMINE SULFATE ER 0.375 MG PO TB12: 375.0000 ug | ORAL_TABLET | Freq: Two times a day (BID) | ORAL | Status: DC | PRN

## 2011-04-11 MED ORDER — OMEPRAZOLE 40 MG PO CPDR: 40.0000 mg | DELAYED_RELEASE_CAPSULE | Freq: Every day | ORAL | Status: DC

## 2011-04-11 NOTE — Progress Notes (Signed)
History of Present Illness: This is a 70 year old female with multiple complaints. She states that she "just feels horrible". She complains of generalized chest pain that is present constantly and is not changed with meals or exertion. She notes that the left side of her chest is more painful when she lays down at night. She has throat irritation and ongoing sinus drainage. She notes generalized abdominal pain associated with gas, bloating and looser stools for the past several weeks. She has frequent heartburn and dyspeptic symptoms however, despite recommendations to remain on a proton pump inhibitor, she is currently taking ranitidine. She discontinued Dexilant after one month as she states it was too expensive. Upper endoscopy in November 2011 was normal. Colonoscopy in 2010 showed diverticulosis and hemorrhoids. It was otherwise unremarkable. She denies weight loss, change in stool caliber, melena, hematochezia.   Current Medications, Allergies, Past Medical History, Past Surgical History, Family History and Social History were reviewed in [employer] record.  Physical Exam: General: Well developed , well nourished, no acute distress Head: Normocephalic and atraumatic Eyes:  sclerae anicteric, EOMI Ears: Normal auditory acuity Mouth: No deformity or lesions Lungs: Clear throughout to auscultation Heart: Regular rate and rhythm; no murmurs, rubs or bruits Abdomen: Soft, mild generalized tenderness to light palpation without rebound or guarding and non distended. No masses, hepatosplenomegaly or hernias noted. Normal Bowel sounds Musculoskeletal: Symmetrical with no gross deformities  Pulses:  Normal pulses noted Extremities: No clubbing, cyanosis, edema or deformities noted Neurological: Alert oriented x 4, grossly nonfocal Psychological:  Alert and cooperative. Depressed affect.  1. Generalized abdominal pain associated with gas bloating and looser stools. I suspect she  has irritable bowel syndrome. Begin Levbid twice a day and increase dietary fiber and water intake. Begin a low gas diet. Avoiding foods that exacerbate her symptoms. Consider a trial of probiotics.  2. GERD. Despite recommendations made on several occasions she has not remained on a proton pump inhibitor. Her GERD symptoms are inadequately controlled with H2 receptor antagonists. She is advised to discontinue cimetidine, ranitidine and other H2 receptor antagonists and begin omeprazole 40 mg daily and begin all standard antireflux measures. If her symptoms are not adequately controlled consider increasing omeprazole to twice daily or using another proton pump inhibitor.  3. Chest pain. Symptoms not typical for any gastrointestinal process. Further evaluation with Dr. Clent Ridges.  4. Depression. Depressed affect and multiple somatic complaints concerning for depression. Further evaluation with Dr. Clent Ridges.  5. Family history of colon polyps. Screening colonoscopy recommended April 2015.   Assessment and Recommendations:

## 2011-04-11 NOTE — Patient Instructions (Addendum)
Your prescriptions have been sent to your pharmacy.  Patient advised to follow a low gas diet and to avoid spicy, acidic, citrus, chocolate, mints, fruit and fruit juices.  Limit the intake of caffeine, alcohol and Soda.  Don't exercise too soon after eating.  Don't lie down within 3-4 hours of eating.  Elevate the head of your bed. cc: Gershon Crane, MD

## 2011-04-23 NOTE — Op Note (Signed)
[name], Leah Hodges              ACCOUNT NO.:  [id number]   MEDICAL RECORD NO.:  [id number]          PATIENT TYPE:  INP   LOCATION:  5012                         FACILITY:  [facility]   PHYSICIAN:  Mila Homer. Sherlean Foot, M.D. DATE OF BIRTH:  [birth year]   DATE OF PROCEDURE:  05/06/2008  DATE OF DISCHARGE:  05/09/2008                               OPERATIVE REPORT   SURGEON:  Mila Homer. Sherlean Foot, MD   ASSISTANT:  Laural Benes. Su Hilt, [place]   ANESTHESIA:  General.   PREOPERATIVE DIAGNOSIS:  Left knee osteoarthritis.   POSTOPERATIVE DIAGNOSIS:  Left knee osteoarthritis.   PROCEDURE:  Left total knee arthroplasty.   INDICATIONS FOR PROCEDURE:  The patient is a 70 year old white female  with prior history of osteoarthritis of the knee.  Informed consent  obtained.   DESCRIPTION OF PROCEDURE:  The patient was laid supine, administered  general anesthesia.  Foley catheter was placed.  Left leg was prepped  and draped in the usual sterile fashion.  Extremity was exsanguinated  with the Esmarch.  Tourniquet applied to 350 mmHg.  Midline incision was  made with #10 blade.  New blade was used to make medial parapatellar  arthrotomy and performed synovectomy and elevated the deep MCL off the  medial crest of the tibia.  I performed the patella first using a 32-mm  reamer reamed down 9 mm and drilled through lug holes and recreated the  native thickness.  Removed prosthetic trial into flexion, used the  extramedullary alignment system on the tibia and the intramedullary  alignment on the femur.  I then placed a lamina spreader in the remove  the ACL, PCL, and medial lateral menisci.  We placed the 10-mm spacer  block in the knee.  Had good flexion/extension gap balance.  I then  sized the femur to a size D, cut the cutting block and 3 degrees of  external rotation and finished the femur with the femoral finishing  block.  Sized the tibia to a size 3 drill and keel pin through implant.  I then trialed with  3 tibia, 3 femur, 10 insert, 32 patella and upper  extension gap balance dropping angle back down to 125 degrees.  All  trial components were copiously irrigated.  I then cemented components  removed all excess cement, allowed cement to harden in extension.  I  then left a Hemovac coming out superolaterally and deep arthrotomy pain  catheter coming out superomedial and superficial arthrotomy.  Closed the  arthrotomy after allowing the tourniquet down, obtained hemostasis.  Then left the cautery, deep soft tissues buried with 0 Vicryl  subcuticular 3-0 Vicryl skin staples.  Dressed with Xeroform dressing  sponges, sterile Webril, TED stocking.   COMPLICATIONS:  None.   DRAINS:  None.           ______________________________  Mila Homer. Sherlean Foot, M.D.     SDL/MEDQ  D:  08/22/2008  T:  08/23/2008  Job:  [id number]

## 2011-04-26 NOTE — Op Note (Signed)
[facility]  Patient:    Leah Hodges, Leah Hodges Visit Number: [id number] MRN: [id number]          Service Type: SUR Attending Physician:  Drucilla Schmidt Md Dictated by:   Illene Labrador. Aplington, M.D. Proc. Date: 04/23/02 Admit Date:  04/23/2002 Discharge Date: 04/24/2002                             OperativeReport  PREOPERATIVE DIAGNOSES:  1. Painful degenerative arthritis, AC joint.  2. 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  POSTOPERATIVE DIAGNOSES:  1. Painful degenerative arthritis, AC joint.  2.Chronic impingement syndrome with partial rotator cuff tear and labral  disruption.  OPERATION PERFORMED:  1. Open resection distal right clavicle.  2. Right shoulder arthroscopy with a) shaving of underneath surface of the     rotator cuff and debridement of labrum, b) arthroscopic subacromial     decompression.  SURGEON:  Illene Labrador. Aplington,M.D.  ASSISTANT:  Colleen P. Mahar, P.A.  ANESTHESIA:  General.  PATHOLOGY AND JUSTIFICATION FOR PROCEDURE:  History of progressivepersistent right shoulder pain with MRI showing the above mentioned diagnoses. She had tenderness at the [facility] joint in addition to about the rotator cuff.  DESCRIPTION OF PROCEDURE:  Satisfactory general anesthesia, beach chair position on the Schlein frame, the right shoulder girdle was prepped with Duraprep, draped in a sterile field. The anatomy of the shoulder joint was marked out including a lateral portal, posterior soft spot portal in the [facility] joint. The two portals on the subacromial space were infiltrated with 0.5% Marcaine with adrenaline. Then through a posterior soft spot stick, I advanced a blunt trocar atraumatically into the glenohumeral joint. The biceps tendon looked normal as did the humeral head but she had a good bit of wear of the labrum adjacent to the biceps attachment.There is no frank labral  disruption, however. The underneath surface of the rotator cuff was quite frayed. Accordingly, I advanced the scope anteriorly between the long head of the biceps tendon and subscapularis and placed a switching stick and made an anterior incision followed by a smallmetal cannula and a 4.2 shaver. I then debrided down the labral disruption and shaved the underneath surface of the rotator cuff. Pre and post films were taken. I then removed fluid from the joint and redirected the scope to the subacromial area and through a lateral portal placed a blunt trocar followed by a 4.2 shaver and removed a fairly exuberant subdeltoid bursa. I then brought in the arthrocare vaporizer and removed soft tissuefrom the underneath surface of the acromion as well as around the perimeter including the coracoacromial ligament. I then removed the vaporizer and placed a 4.0 oval bur through the lateral portal and burred down the underneath surface of the anterior acromion and then went back and forth between the vaporizer and the bur until I had what I felt was nice subacromial decompression. I then took documentary pictures with the arm to his side and the arm abducted with the vaporizer instrument in place. I then evacuated all fluid from the subacromial joint and made an incision over the distal clavicle and exposed it with subperiosteal dissection demarcating the [facility] joint. I then measured out about 1 1/2 cm of distal clavicle and marked this on the clavicle with a Bovie and with periosteal elevator gently undermined the underneath surface of the clavicle. At this point, I placed  two baby Bennett retractors protecting the underlying structures and used a microsaw to cut the clavicle at this point. I then grasped the cutportion and dissected it out with Bovie dissection. I then checked and made sure there were no bony spicules remaining on the parent clavicle which I covered with bone wax. I then placed Gelfoam  in the interval from the resection of the clavicle. First I irrigated the wound with sterile saline and then I infiltrated the soft tissues, the portals and the subacromial space once again with 0.5% Marcaine with adrenaline. I then closed the muscle and fascia over the clavicle and resected the site with interrupted #1 Vicryl, subcutaneous tissue with interrupted 2-0 Vicryl deep and 4-0 Vicryl superficially with Steri-Strips on the skin and 4-0 nylon in the three portals. Adaptic dry sterile dressing followed by short immobilizers were applied. The patient tolerated the procedure well and was taken to the recovery room in satisfactory condition with no known complications. Dictated by:   Illene Labrador. Aplington, M.D. Attending Physician:  Drucilla Schmidt Md DD:  04/23/02 TD:  04/25/02 Job: [id number]

## 2011-04-26 NOTE — Cardiovascular Report (Signed)
NAMEFILOMENA, Leah Hodges              ACCOUNT NO.:  [id number]   MEDICAL RECORD NO.:  [id number]          PATIENT TYPE:  OIB   LOCATION:  2870                         FACILITY:  [facility]   PHYSICIAN:  Carole Binning, M.D. LHCDATE OF BIRTH:  [birth year]   DATE OF PROCEDURE:  02/27/2005  DATE OF DISCHARGE:                              CARDIAC CATHETERIZATION   PROCEDURE PERFORMED:  Left heart catheterization with coronary angiography  and left ventriculography.   INDICATIONS:  [name] is a 70 year old woman with chest pain. A recent  stress nuclear study showed anterior apical ischemia. She was therefore  referred for cardiac catheterization.   PROCEDURE NOTE:  A 6-French sheath was placed in the right femoral artery.  Coronary angiography was performed with standard Judkins 6-French catheters.  Left ventriculography was performed with an angled pigtail catheter.  Contrast was Omnipaque. There were no complications.   HEMODYNAMIC RESULTS:  1.  Left ventricular pressure 184/20.  2.  Aortic pressure 184/80.  3.  There is no aortic valve gradient.   LEFT VENTRICULOGRAM:  Left ventricular wall motion is normal. Ejection  fraction estimated at 65%. There is no mitral regurgitation.   CORONARY ARTERIOGRAPHY:  1.  Left main is normal.  2.  Left anterior descending artery gives rise to normal-sized first      diagonal branch and a small second diagonal branch. LAD is normal.  3.  Left circumflex gives rise to a small ramus intermedius, small first      obtuse marginal branch, normal size second obtuse marginal branch, and a      normal-sized third obtuse marginal branch. Left circumflex is normal.  4.  Right coronary artery is a dominant vessel. It gives rise to a large      posterior descending artery and a small posterior branch. The right      coronary is normal.   IMPRESSION:  1.  Normal left ventricular systolic function.  2.  Normal coronary artery disease.   PLAN:  The  patient will be managed medically. Alternative etiologies for her  chest pain will be investigated.      MWP/MEDQ  D:  02/27/2005  T:  02/28/2005  Job:  [id number]   cc:   Ramond Marrow, M.D.

## 2011-04-26 NOTE — Assessment & Plan Note (Signed)
[facility] OFFICE NOTE   Leah Hodges, Leah Hodges                     MRN:          [id number]  DATE:11/20/2006                            DOB:          May 04, 1941    This is a 70 year old woman here for a complete physical examination.  She has a couple of things to discuss. I saw her several weeks ago with  complaints of allergies. She started on Nasacort AQ nasal sprays daily  and has seen some improvement. We also started on Nexium for acid reflux  symptoms and has seen some improvement on that. She also has a history  of overactive bladder with urgency, frequency, and occasional  incontinence. She has used agents like, Detrol, and Vesicare with some  improvement in the past. She then got off of them for several years. She  is interested on getting back on something now. We have also been  following her for degenerative joint disease, hypertension, and  osteopenia. She had a bone densitometry evaluation last year, showing  osteopenia and we plan to do a follow up scan next year. She is not due  for another colonoscopy until next year as well. For other details of  her past medical history, family history, social history etc, refer to  our last physical note dated February 12, 2005.   ALLERGIES:  None.   CURRENT MEDICATIONS:  Lisinopril HCT 20/25 once a day, multivitamins,  osteo Bi-flex, fish oil daily, Nexium 40 mg per day, Nasacort AQ sprays  daily, and Patanol eye drops as needed.   OBJECTIVE:  Height 5 foot 4 inches, weight 210, blood pressure 128/88,  pulse 68 and regular.  In general: She remains obese.  SKIN: Clear  EYES: Clear, she wears glasses.  EARS: Clear, pharynx clear.  NECK: Supple without lymphadenopathy or masses.  LUNGS: Clear  CARDIAC: Rate and rhythm regular without murmurs, rubs, gallops, distal  pulses are full.   EKG: Within normal limits.  Breasts and auxiliary are clear.  ABDOMEN:  Soft, normal bowel sounds, nontender, no masses.  EXTERNAL GENITALIA: Within normal limits.  RECTAL EXAM: No mass or tenderness, stool hemoccult negative.  EXTREMITIES: No clubbing, cyanosis, or edema.  NEUROLOGICAL EXAM: Grossly intact.   ASSESSMENT/PLAN:  1. Complete physical, she is fasting today so we will check the usual      laboratories. She will set up her usual screening mammogram and we      talked more about diet, exercise, and weight loss and how important      these would be for her.  2. Hypertension, stable.  3. Gastroesophageal reflux disease, stable.  4. Allergies, stable.  5. Degenerative joint disease, stable.  6. Overactive bladder, will get back on Detrol LA 4 mg once a day.     Tera Mater. Clent Ridges, MD  Electronically Signed    SAF/MedQ  DD: 11/20/2006  DT: 11/20/2006  Job #: [id number]

## 2011-04-26 NOTE — Discharge Summary (Signed)
Leah Hodges, Leah Hodges              ACCOUNT NO.:  [id number]   MEDICAL RECORD NO.:  [id number]          PATIENT TYPE:  INP   LOCATION:  5012                         FACILITY:  [facility]   PHYSICIAN:  Mila Homer. Sherlean Foot, M.D. DATE OF BIRTH:  Sep 03, 1941   DATE OF ADMISSION:  05/06/2008  DATE OF DISCHARGE:  05/09/2008                               DISCHARGE SUMMARY   FINAL DIAGNOSIS FOR THIS ADMISSION:  End-stage degenerative joint  disease of the left knee.   PROCEDURE WHILE IN HOSPITAL:  Left total knee arthroplasty.   DISCHARGE SUMMARY:  The patient is a 70 year old woman with many-year  history of pain in her left knee.  She underwent left knee scope on  April 08, 2007, with positive grade 2-3 chondromalacia and focal areas  of grade 4.  The pain is intensified despite conservative care including  rest, physical therapy, steroid injections with positive catching, and  near giving way.  The patient wishes to proceed with left total knee  arthroplasty after discussing the risks and benefits of the surgery.  X-  rays show bone-on-bone DJD changes.   MEDICAL HISTORY:  Significant for hypertension, sleep apnea, and  sinusitis.   SURGICAL HISTORY:  Positive for tonsillectomy, gallbladder,  hysterectomy, rotator cuff, and knee ligament repair.  No adverse  reactions to anesthesia.   MEDICATIONS:  At the time of admission include Lisinopril, Celebrex,  Nexium, Citracal, aspirin, and omega fish oil.   ALLERGIES:  The patient is not allergic to any medications.  She does  have seasonal allergies.   SOCIAL HISTORY:  The patient does not use tobacco.  She has occasional  ethanol.  No other drug history.  The patient lives with her husband in  a one-story residence.   FAMILY HISTORY:  Positive for hypertension and stroke, and her mother  died at age 81 off MI, and her father died at age 60.   REVIEW OF SYSTEMS:  Positive for cataracts, glasses, decreased hearing,  occasional ringing in  ears, difficulty swallowing, hoarseness, upper and  lower dentures, small on the upper portion than the lower, occasional  diarrhea, and weight gain.   PREOPERATIVE LABS:  Including CBC, CMET, chest x-ray, EKG, PT, and PTT  were within normal limits.   PHYSICAL EXAMINATION:  VITAL SIGNS:  The patient to be 5 feet and 3  inches, 206-pound female.  Temperature 90.2, pulse 60, respirations 18,  and blood pressure 124/68.  HEAD::  Normocephalic and atraumatic.  Pupils are equal, round, and  reactive to light and accommodation.  EARS:  TMs clear.  NOSE AND THROAT:  Patent.  NECK:  Full range of motion.  CHEST:  Clear to auscultation percussion.  HEART:  Regular rate and rhythm.  ABDOMEN:  Soft and nontender.  SKIN:  Intact.  EXTREMITIES:  Range of motion of the left knee shows to be 5-110 degrees  of 5-degree valgus deformity.  Positive lateral greater than medial  joint tenderness.  Positive crepitus.   HOSPITAL COURSE:  On the day of admission, the patient was taken to the  operating room at [facility] where she underwent  a left total knee  arthroplasty using a Zimmer NexGen components with a size D left femur  and size 3 fluted-stem tibia, a 10-mm bearing, and a 32-mm of patella.  Components were cemented.  Hemovac drain was placed into the wound,  single-armed, and On-AQ Marcaine pump was placed to the wound as well.  The patient was placed on perioperative antibiotics.  She was placed on  postoperative Lovenox prophylaxis, per pharmacy protocol.  She was  placed on PCA Dilaudid for better pain control.  Physical therapy was  begun in the PACU using a CPM and physical therapy was begun on the  first postoperative day in earnest.  On postoperative day #1, the  patient was complaining of only minimal pain, denied any chest pain or  shortness of breath.  No emesis.  Only minimal nausea.  Pain well  controlled with meds.  Vitals were stable.  Hemoglobin 9.1.  The wound  was clean  and dry.  Hemovac was discontinued.  Calf was soft and  nontender.  She was neurovascularly intact.  Tolerating CPM 0-90  degrees.  Had no symptoms of her acute blood loss anemia, has otherwise  tolerated treatment well.  Physical therapy continued.  On postoperative  #2, the patient was complaining of minimal pain, tolerating diet without  nausea or vomiting, voiding without difficulty.  Temperature 99.9.  Hemoglobin 8.3, but again, no symptoms of anemia.  Wound was clean and  dry.  Calf was soft and nontender.  She was neurovascularly intact.  Range of motion 0-90 degrees in the CPM.  She was, otherwise, medically  stable, and orthopedically improved, and was discharged home to the care  of her family after completing physical therapy goals.   ACTIVITIES:  Weightbearing as tolerated with total knee precautions and  using a walker for ambulation.   She will return to see Dr. Sherlean Foot in 2 weeks' time.   DIET:  Regular.  Dressing changes as needed.   MEDICATIONS AT TIME OF DISCHARGE:  The patient will resume her omega-3  fish oil, Celebrex, lisinopril, hydrochlorothiazide, Nexium, and Detrol.  She will hold on the aspirin.  She will also add Percocet for pain,  prescription given.  Robaxin for muscle spasms, prescription given.  Lovenox 40 mg per day for 2 weeks postoperatively for which she is  instructed before she left the hospital.   FINAL DIAGNOSES:  End-stage degenerative joint disease of the left knee.   PROCEDURE IN HOSPITAL:  Left total knee arthroplasty.        Leah Hodges. Leah Hodges.    ______________________________  Mila Homer Sherlean Foot, M.D.    Merita Norton  D:  06/24/2008  T:  06/25/2008  Job:  [id number]

## 2011-04-26 NOTE — Op Note (Signed)
[name], Leah Hodges              ACCOUNT NO.:  [id number]   MEDICAL RECORD NO.:  [id number]          PATIENT TYPE:  AMB   LOCATION:  DSC                          FACILITY:  [facility]   PHYSICIAN:  Mila Homer. Sherlean Foot, M.D. DATE OF BIRTH:  February 27, 1941   DATE OF PROCEDURE:  04/08/2007  DATE OF DISCHARGE:                               OPERATIVE REPORT   SURGEON:  Mila Homer. Sherlean Foot, M.D.   ASSISTANT:  None.   ANESTHESIA:  MAC.   PREOPERATIVE DIAGNOSES:  1. Left knee osteoarthritis.  2. Lateral meniscus tear.   POSTOPERATIVE DIAGNOSES:  1. Left knee osteoarthritis.  2. Lateral meniscus tear.  3. Medial meniscus tear and plica.   PROCEDURE:  Left knee arthroscopy with partial medial and lateral  meniscectomies, chondroplasty in the medial and patellofemoral  compartments and a plicectomy.   INDICATIONS FOR PROCEDURE:  The patient is a 70 year old with mechanical  symptoms and MRI evidence of a anterior horn lateral meniscus tear.  Informed consent was obtained.   DESCRIPTION OF PROCEDURE:  The patient was laid supine and administered  MAC anesthesia.  Left leg was prepped and draped in the usual sterile  fashion.  Inferolateral and anteromedial portals were created with a #11  blade, blunt trocar and cannula.  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  The plica was resected with the [facility]  shaver.  Then went into the medial compartment.  There was some small  areas of grade II and III chondromalacia.  Chondroplasty was performed  here as well with a Biochemist, clinical.  There was small radial tears of  the posterior horn and medial meniscus.  This was trimmed up with  straight basket forceps and the pieces removed with the [employer].  Went into the figure-four position.  There was a very complex  anterior horn tear.  I debrided that with the [employer] shaver,  straight basket forceps and  an [employer] capture wand.  A small radial  tears of the posterior horn was also cleaned up straight basket forceps  and a Biochemist, clinical.  I then lavaged and closed with 4-0 nylon  sutures, dressed with Xeroform, dressing sponges, sterile Webril and Ace  wrap.   COMPLICATIONS:  None.   DRAINS:  None.           ______________________________  Mila Homer. Sherlean Foot, M.D.     SDL/MEDQ  D:  04/08/2007  T:  04/08/2007  Job:  [id number]

## 2011-07-16 ENCOUNTER — Ambulatory Visit (INDEPENDENT_AMBULATORY_CARE_PROVIDER_SITE_OTHER): Payer: Medicare Other | Admitting: Family Medicine

## 2011-07-16 ENCOUNTER — Encounter: Payer: Self-pay | Admitting: Family Medicine

## 2011-07-16 VITALS — BP 120/80 | HR 73 | Temp 98.4°F | Wt 182.0 lb

## 2011-07-16 DIAGNOSIS — R739 Hyperglycemia, unspecified: Secondary | ICD-10-CM

## 2011-07-16 DIAGNOSIS — R7309 Other abnormal glucose: Secondary | ICD-10-CM

## 2011-07-16 DIAGNOSIS — Z79899 Other long term (current) drug therapy: Secondary | ICD-10-CM

## 2011-07-16 DIAGNOSIS — I10 Essential (primary) hypertension: Secondary | ICD-10-CM

## 2011-07-16 DIAGNOSIS — K219 Gastro-esophageal reflux disease without esophagitis: Secondary | ICD-10-CM

## 2011-07-16 LAB — POCT URINALYSIS DIPSTICK
Bilirubin, UA: NEGATIVE
Glucose, UA: NEGATIVE
Ketones, UA: NEGATIVE
Spec Grav, UA: 1.02

## 2011-07-16 LAB — BASIC METABOLIC PANEL
Calcium: 9.3 mg/dL (ref 8.4–10.5)
Chloride: 104 mEq/L (ref 96–112)
Creatinine, Ser: 0.9 mg/dL (ref 0.4–1.2)

## 2011-07-16 LAB — HEMOGLOBIN A1C: Hgb A1c MFr Bld: 5.9 % (ref 4.6–6.5)

## 2011-07-16 LAB — TSH: TSH: 0.87 u[IU]/mL (ref 0.35–5.50)

## 2011-07-16 LAB — CBC WITH DIFFERENTIAL/PLATELET
Basophils Relative: 0.6 % (ref 0.0–3.0)
Hemoglobin: 12.1 g/dL (ref 12.0–15.0)
Lymphocytes Relative: 33.2 % (ref 12.0–46.0)
Monocytes Relative: 8.4 % (ref 3.0–12.0)
Neutro Abs: 3.8 10*3/uL (ref 1.4–7.7)
RBC: 4.07 Mil/uL (ref 3.87–5.11)
WBC: 6.7 10*3/uL (ref 4.5–10.5)

## 2011-07-16 LAB — HEPATIC FUNCTION PANEL
ALT: 13 U/L (ref 0–35)
Bilirubin, Direct: 0 mg/dL (ref 0.0–0.3)
Total Bilirubin: 0.6 mg/dL (ref 0.3–1.2)

## 2011-07-16 LAB — LIPID PANEL
Cholesterol: 203 mg/dL — ABNORMAL HIGH (ref 0–200)
Total CHOL/HDL Ratio: 4
VLDL: 31.8 mg/dL (ref 0.0–40.0)

## 2011-07-16 NOTE — Progress Notes (Signed)
  Subjective:    Patient ID: Leah Hodges, female    DOB: May 31, 1941, 70 y.o.   MRN: [id number]  HPI Here asking for lab work since she thinks she may be diabetic. She has a strong family hx of this. Lately she has felt more tired than usual, and sometimes she gets a little lightheaded. She saw Dr. Russella Dar a few months ago for heartburn and some nocturnal chest pains, and GERD was felt to be the culprit. She was started on Omeprazole, and now she feels much better. She has gone on a low carbohydrate diet and this has helped quite a bit. She has lost a little weight.    Review of Systems  Constitutional: Negative.   Respiratory: Negative.   Cardiovascular: Negative.   Neurological: Positive for light-headedness. Negative for dizziness, syncope and weakness.       Objective:   Physical Exam  Constitutional: She is oriented to person, place, and time. She appears well-developed and well-nourished.  Cardiovascular: Normal rate, regular rhythm, normal heart sounds and intact distal pulses.   Pulmonary/Chest: Effort normal and breath sounds normal.  Neurological: She is alert and oriented to person, place, and time. No cranial nerve deficit. She exhibits normal muscle tone. Coordination normal.          Assessment & Plan:  Get fasting labs. Her GERD is well controlled now.

## 2011-07-17 LAB — LDL CHOLESTEROL, DIRECT: Direct LDL: 123.8 mg/dL

## 2011-07-17 NOTE — Progress Notes (Signed)
Spoke with pt and gave results. 

## 2011-08-07 ENCOUNTER — Other Ambulatory Visit: Payer: Self-pay | Admitting: Family Medicine

## 2011-09-04 LAB — CROSSMATCH
ABO/RH(D): O POS
Antibody Screen: NEGATIVE
Antibody Screen: NEGATIVE

## 2011-09-04 LAB — COMPREHENSIVE METABOLIC PANEL
AST: 21
Alkaline Phosphatase: 94
CO2: 28
Chloride: 105
Creatinine, Ser: 0.77
GFR calc Af Amer: >60
GFR calc non Af Amer: >60
Potassium: 4
Total Bilirubin: 0.5

## 2011-09-04 LAB — CBC
HCT: 24.3 — ABNORMAL LOW
HCT: 27 — ABNORMAL LOW
HCT: 36
MCHC: 33.9
MCV: 87.5
Platelets: 172
Platelets: 224
RBC: 4.11
RDW: 13.8
RDW: 14
WBC: 8.8
WBC: 9.2

## 2011-09-04 LAB — BASIC METABOLIC PANEL
BUN: 17
BUN: 18
Calcium: 8.2 — ABNORMAL LOW
Chloride: 105
GFR calc non Af Amer: 57 — ABNORMAL LOW
GFR calc non Af Amer: >60
Glucose, Bld: 121 — ABNORMAL HIGH
Potassium: 4
Potassium: 4.4
Sodium: 139

## 2011-09-04 LAB — URINALYSIS, ROUTINE W REFLEX MICROSCOPIC
Bilirubin Urine: NEGATIVE
Glucose, UA: NEGATIVE
Hgb urine dipstick: NEGATIVE
Ketones, ur: NEGATIVE
Protein, ur: NEGATIVE
pH: 5.5

## 2011-09-04 LAB — DIFFERENTIAL
Basophils Absolute: 0
Basophils Relative: 0
Eosinophils Absolute: 0.1
Eosinophils Relative: 1
Lymphocytes Relative: 27
Monocytes Absolute: 0.7

## 2011-09-04 LAB — URINE CULTURE: Colony Count: 45000

## 2011-09-04 LAB — PROTIME-INR: Prothrombin Time: 11.8

## 2011-09-04 LAB — URINE MICROSCOPIC-ADD ON

## 2011-09-05 LAB — CBC
Platelets: 184
RBC: 3.35 — ABNORMAL LOW
WBC: 9.1

## 2011-10-21 ENCOUNTER — Ambulatory Visit (INDEPENDENT_AMBULATORY_CARE_PROVIDER_SITE_OTHER): Payer: Medicare Other | Admitting: Family Medicine

## 2011-10-21 ENCOUNTER — Encounter: Payer: Self-pay | Admitting: Family Medicine

## 2011-10-21 ENCOUNTER — Other Ambulatory Visit: Payer: Self-pay | Admitting: Neurology

## 2011-10-21 VITALS — BP 112/70 | HR 70 | Temp 98.7°F | Wt 175.0 lb

## 2011-10-21 DIAGNOSIS — R5383 Other fatigue: Secondary | ICD-10-CM

## 2011-10-21 DIAGNOSIS — R259 Unspecified abnormal involuntary movements: Secondary | ICD-10-CM

## 2011-10-21 DIAGNOSIS — R531 Weakness: Secondary | ICD-10-CM

## 2011-10-21 DIAGNOSIS — R251 Tremor, unspecified: Secondary | ICD-10-CM

## 2011-10-21 DIAGNOSIS — G20 Parkinson's disease: Secondary | ICD-10-CM

## 2011-10-21 DIAGNOSIS — G2581 Restless legs syndrome: Secondary | ICD-10-CM

## 2011-10-21 NOTE — Progress Notes (Signed)
  Subjective:    Patient ID: Leah Hodges, female    DOB: [birth year], 70 y.o.   MRN: [id number]  HPI Here for a thryoid workup. She has been seeing Dr. Anne Hahn for tremors, and he thinks she likely has early Parkinsons syndrome. He felt she may have a thyroid issue and asked [place] to see her. She does describes chronic fatigue, but of course she has multiple problems going on now. No recent weight changes.,    Review of Systems  Constitutional: Positive for fatigue. Negative for activity change, appetite change and unexpected weight change.  Respiratory: Negative.   Cardiovascular: Negative.        Objective:   Physical Exam  Constitutional: She appears well-developed and well-nourished.  Neck: No thyromegaly present.  Cardiovascular: Normal rate, regular rhythm, normal heart sounds and intact distal pulses.   Pulmonary/Chest: Effort normal and breath sounds normal.  Lymphadenopathy:    She has no cervical adenopathy.          Assessment & Plan:  Get a thyroid panel

## 2011-10-22 LAB — T3, FREE: T3, Free: 3.1 pg/mL (ref 2.3–4.2)

## 2011-10-22 LAB — T4, FREE: Free T4: 1.25 ng/dL (ref 0.60–1.60)

## 2011-10-22 LAB — TSH: TSH: 0.68 u[IU]/mL (ref 0.35–5.50)

## 2011-10-23 ENCOUNTER — Other Ambulatory Visit: Payer: Medicare Other

## 2011-10-23 NOTE — Progress Notes (Signed)
Quick Note:  Spoke with pt ______ 

## 2011-10-24 ENCOUNTER — Ambulatory Visit
Admission: RE | Admit: 2011-10-24 | Discharge: 2011-10-24 | Disposition: A | Discharge status: Home / Self Care | Payer: Medicare Other | Source: Ambulatory Visit | Attending: Neurology | Admitting: Neurology

## 2011-10-24 DIAGNOSIS — G20 Parkinson's disease: Secondary | ICD-10-CM

## 2011-10-24 DIAGNOSIS — G2581 Restless legs syndrome: Secondary | ICD-10-CM

## 2012-07-28 ENCOUNTER — Telehealth: Payer: Self-pay | Admitting: Family Medicine

## 2012-07-28 MED ORDER — LISINOPRIL-HYDROCHLOROTHIAZIDE 20-25 MG PO TABS: 1.0000 | ORAL_TABLET | Freq: Every day | ORAL | Status: DC

## 2012-07-28 NOTE — Telephone Encounter (Signed)
Refill request for Lisinopril/HCTZ and I did send script e-scribe. 

## 2012-10-20 ENCOUNTER — Ambulatory Visit (INDEPENDENT_AMBULATORY_CARE_PROVIDER_SITE_OTHER): Payer: Medicare Other | Admitting: Family Medicine

## 2012-10-20 ENCOUNTER — Encounter: Payer: Self-pay | Admitting: Family Medicine

## 2012-10-20 ENCOUNTER — Telehealth: Payer: Self-pay | Admitting: Family Medicine

## 2012-10-20 VITALS — BP 116/70 | HR 70 | Temp 98.5°F | Ht 63.0 in | Wt 190.0 lb

## 2012-10-20 DIAGNOSIS — I10 Essential (primary) hypertension: Secondary | ICD-10-CM

## 2012-10-20 DIAGNOSIS — Z79899 Other long term (current) drug therapy: Secondary | ICD-10-CM

## 2012-10-20 DIAGNOSIS — Z0000 Encounter for general adult medical examination without abnormal findings: Secondary | ICD-10-CM

## 2012-10-20 LAB — LIPID PANEL
Cholesterol: 217 mg/dL — ABNORMAL HIGH (ref 0–200)
Total CHOL/HDL Ratio: 4
Triglycerides: 162 mg/dL — ABNORMAL HIGH (ref 0.0–149.0)

## 2012-10-20 LAB — HEPATIC FUNCTION PANEL
ALT: 20 U/L (ref 0–35)
Albumin: 3.9 g/dL (ref 3.5–5.2)
Bilirubin, Direct: 0 mg/dL (ref 0.0–0.3)
Total Protein: 7 g/dL (ref 6.0–8.3)

## 2012-10-20 LAB — MAGNESIUM: Magnesium: 2 mg/dL (ref 1.5–2.5)

## 2012-10-20 LAB — CBC WITH DIFFERENTIAL/PLATELET
Basophils Absolute: 0 10*3/uL (ref 0.0–0.1)
Eosinophils Absolute: 0.1 10*3/uL (ref 0.0–0.7)
HCT: 38 % (ref 36.0–46.0)
Lymphs Abs: 2.2 10*3/uL (ref 0.7–4.0)
MCV: 87.8 fl (ref 78.0–100.0)
Monocytes Absolute: 0.6 10*3/uL (ref 0.1–1.0)
Neutrophils Relative %: 65.4 % (ref 43.0–77.0)
Platelets: 251 10*3/uL (ref 150.0–400.0)
RDW: 14.3 % (ref 11.5–14.6)

## 2012-10-20 LAB — BASIC METABOLIC PANEL
CO2: 29 mEq/L (ref 19–32)
Calcium: 9.1 mg/dL (ref 8.4–10.5)
Chloride: 103 mEq/L (ref 96–112)
Creatinine, Ser: 0.8 mg/dL (ref 0.4–1.2)
Glucose, Bld: 105 mg/dL — ABNORMAL HIGH (ref 70–99)

## 2012-10-20 LAB — VITAMIN B12: Vitamin B-12: 586 pg/mL (ref 211–911)

## 2012-10-20 LAB — POCT URINALYSIS DIPSTICK
Blood, UA: NEGATIVE
Glucose, UA: NEGATIVE
Nitrite, UA: NEGATIVE
Protein, UA: NEGATIVE
Spec Grav, UA: 1.025
Urobilinogen, UA: 0.2

## 2012-10-20 MED ORDER — LISINOPRIL-HYDROCHLOROTHIAZIDE 10-12.5 MG PO TABS: 1.0000 | ORAL_TABLET | Freq: Every day | ORAL | Status: DC

## 2012-10-20 MED ORDER — ROPINIROLE HCL 2 MG PO TABS: 2.0000 mg | ORAL_TABLET | Freq: Every day | ORAL | Status: DC

## 2012-10-20 MED ORDER — TRAMADOL HCL 50 MG PO TABS: 50.0000 mg | ORAL_TABLET | Freq: Two times a day (BID) | ORAL | Status: DC

## 2012-10-20 MED ORDER — MELOXICAM 15 MG PO TABS: 15.0000 mg | ORAL_TABLET | Freq: Every day | ORAL | Status: DC

## 2012-10-20 NOTE — Telephone Encounter (Signed)
Tell her to increase the Zantac to a total of 300 mg twice a day

## 2012-10-20 NOTE — Telephone Encounter (Signed)
Patient calling, she didn't get an answer this morning about continuing the Zantac that she has been on for the reflux.  Should she continue with the Zantac or try Prilosec?

## 2012-10-20 NOTE — Progress Notes (Signed)
Subjective:    Patient ID: Leah Hodges, female    DOB: 04-01-1941, 71 y.o.   MRN: [id number]  HPI 71 yr old female for a cpx. She has multiple complaints today and wants to address a myriad of issues. She last saw Dr. Anne Hahn in February and he wanted her to try Requip for restless legs. Apparently the escribe did not go through and she never got the medication. Her legs bother her every night and make it difficult to sleep. She feels tired all the time and she has not been exercising. Consequently she has put on some weight. She takes tramadol bid but she thinks this makes her even more tired and she wants to get off this if possible. Her joints are painful most of the time.    Review of Systems  Constitutional: Positive for fatigue. Negative for fever, diaphoresis, activity change, appetite change and unexpected weight change.  HENT: Negative.  Negative for hearing loss, ear pain, nosebleeds, congestion, sore throat, trouble swallowing, neck pain, neck stiffness, voice change and tinnitus.   Eyes: Negative.  Negative for photophobia, pain, discharge, redness and visual disturbance.  Respiratory: Negative.  Negative for apnea, cough, choking, chest tightness, shortness of breath, wheezing and stridor.   Cardiovascular: Negative.  Negative for chest pain, palpitations and leg swelling.  Gastrointestinal: Negative.  Negative for nausea, vomiting, abdominal pain, diarrhea, constipation, blood in stool, abdominal distention and rectal pain.  Genitourinary: Negative.  Negative for dysuria, urgency, frequency, hematuria, flank pain, vaginal bleeding, vaginal discharge, enuresis, difficulty urinating, vaginal pain and menstrual problem.  Musculoskeletal: Positive for back pain and arthralgias. Negative for myalgias, joint swelling and gait problem.  Skin: Negative.  Negative for color change, pallor, rash and wound.  Neurological: Positive for tremors. Negative for dizziness, seizures, syncope,  speech difficulty, weakness, light-headedness, numbness and headaches.  Hematological: Negative.  Negative for adenopathy. Does not bruise/bleed easily.  Psychiatric/Behavioral: Negative.  Negative for hallucinations, behavioral problems, confusion, sleep disturbance, dysphoric mood and agitation. The patient is not nervous/anxious.        Objective:   Physical Exam  Constitutional: She is oriented to person, place, and time. She appears well-developed and well-nourished. No distress.  HENT:  Head: Normocephalic and atraumatic.  Right Ear: External ear normal.  Left Ear: External ear normal.  Nose: Nose normal.  Mouth/Throat: Oropharynx is clear and moist. No oropharyngeal exudate.  Eyes: Conjunctivae normal and EOM are normal. Pupils are equal, round, and reactive to light. No scleral icterus.  Neck: Normal range of motion. Neck supple. No JVD present. No thyromegaly present.  Cardiovascular: Normal rate, regular rhythm, normal heart sounds and intact distal pulses.  Exam reveals no gallop and no friction rub.   No murmur heard.      EKG normal   Pulmonary/Chest: Effort normal and breath sounds normal. No respiratory distress. She has no wheezes. She has no rales. She exhibits no tenderness.  Abdominal: Soft. Bowel sounds are normal. She exhibits no distension and no mass. There is no tenderness. There is no rebound and no guarding.  Musculoskeletal: Normal range of motion. She exhibits no edema and no tenderness.  Lymphadenopathy:    She has no cervical adenopathy.  Neurological: She is alert and oriented to person, place, and time. She has normal reflexes. No cranial nerve deficit. She exhibits normal muscle tone. Coordination normal.  Skin: Skin is warm and dry. No rash noted. No erythema.  Psychiatric: She has a normal mood and affect. Her behavior is normal.  Judgment and thought content normal.          Assessment & Plan:  Well exam. We will start her on Meloxicam in the  mornings and decrease the Tramadol to once a day at night. Hopefully we can stop this entirely sometime soon. Decrease the Lisinopril HCTZ by one half. Try Requip at bedtime. Get fasting labs. She needs to set up a mammogram soon.

## 2012-10-21 NOTE — Telephone Encounter (Signed)
I spoke with pt  

## 2012-10-21 NOTE — Progress Notes (Signed)
Quick Note:  I spoke with pt ______ 

## 2012-11-03 ENCOUNTER — Telehealth: Payer: Self-pay | Admitting: Family Medicine

## 2012-11-03 NOTE — Telephone Encounter (Signed)
Patient is requesting a script for Omeprazole as her insurance will cover it cheaper than purchased over the counter.  She would like to try this medication instead of the Zantac.  Patient states that her symptoms are under control however she would like see if it works so that she only has to take one tab instead of four which would be more cost efficient.   [facility] [address] [phone].

## 2012-11-04 MED ORDER — OMEPRAZOLE 40 MG PO CPDR: 40.0000 mg | DELAYED_RELEASE_CAPSULE | Freq: Every day | ORAL | Status: DC

## 2012-11-04 NOTE — Telephone Encounter (Signed)
Call in Omeprazole 40 mg a day, #30 with 11 rf

## 2012-11-04 NOTE — Telephone Encounter (Signed)
Rx called in to pharmacy. Pt is aware.  

## 2013-02-24 ENCOUNTER — Encounter: Payer: Self-pay | Admitting: Family Medicine

## 2013-03-15 ENCOUNTER — Ambulatory Visit: Payer: Medicare Other | Admitting: Family Medicine

## 2013-03-17 ENCOUNTER — Encounter: Payer: Self-pay | Admitting: Family Medicine

## 2013-03-17 ENCOUNTER — Ambulatory Visit (INDEPENDENT_AMBULATORY_CARE_PROVIDER_SITE_OTHER): Payer: Medicare Other | Admitting: Family Medicine

## 2013-03-17 VITALS — BP 124/74 | HR 70 | Temp 99.0°F | Wt 185.0 lb

## 2013-03-17 DIAGNOSIS — R5383 Other fatigue: Secondary | ICD-10-CM

## 2013-03-17 DIAGNOSIS — L659 Nonscarring hair loss, unspecified: Secondary | ICD-10-CM

## 2013-03-17 DIAGNOSIS — R5381 Other malaise: Secondary | ICD-10-CM

## 2013-03-17 DIAGNOSIS — I10 Essential (primary) hypertension: Secondary | ICD-10-CM

## 2013-03-17 LAB — TSH: TSH: 0.72 u[IU]/mL (ref 0.35–5.50)

## 2013-03-17 LAB — CBC WITH DIFFERENTIAL/PLATELET
Basophils Absolute: 0 10*3/uL (ref 0.0–0.1)
Eosinophils Relative: 1.1 % (ref 0.0–5.0)
Hemoglobin: 12.6 g/dL (ref 12.0–15.0)
Lymphocytes Relative: 27.6 % (ref 12.0–46.0)
Monocytes Relative: 7.5 % (ref 3.0–12.0)
Neutro Abs: 5.2 10*3/uL (ref 1.4–7.7)
RBC: 4.38 Mil/uL (ref 3.87–5.11)
RDW: 14.5 % (ref 11.5–14.6)
WBC: 8.2 10*3/uL (ref 4.5–10.5)

## 2013-03-17 LAB — BASIC METABOLIC PANEL
BUN: 22 mg/dL (ref 6–23)
Calcium: 9 mg/dL (ref 8.4–10.5)
Creatinine, Ser: 0.8 mg/dL (ref 0.4–1.2)
GFR: 74.72 mL/min (ref >60.00)
Glucose, Bld: 110 mg/dL — ABNORMAL HIGH (ref 70–99)

## 2013-03-17 LAB — HEPATIC FUNCTION PANEL
AST: 22 U/L (ref 0–37)
Albumin: 3.8 g/dL (ref 3.5–5.2)

## 2013-03-17 MED ORDER — MELOXICAM 15 MG PO TABS: 15.0000 mg | ORAL_TABLET | Freq: Every day | ORAL | Status: DC

## 2013-03-17 NOTE — Progress Notes (Signed)
  Subjective:    Patient ID: Leah Hodges, female    DOB: 08-Jan-1941, 72 y.o.   MRN: [id number]  HPI Here for several months of diffuse symptoms including fatigue, hair loss, sweats, hot flashes, and pain in the right foot. She has tried a topical steroid product on her scalp per her dermatologist, but this has not helped. She is actually wearing a wig now due to the thinning hair. No new medications otherwise.    Review of Systems  Constitutional: Positive for diaphoresis and fatigue. Negative for fever, appetite change and unexpected weight change.  Respiratory: Negative.   Cardiovascular: Negative.   Gastrointestinal: Negative.   Neurological: Negative.        Objective:   Physical Exam  Constitutional: She appears well-developed and well-nourished.  Neck: No thyromegaly present.  Cardiovascular: Normal rate, regular rhythm, normal heart sounds and intact distal pulses.   Pulmonary/Chest: Effort normal and breath sounds normal.  Musculoskeletal:  Tender over the right dorsal foot, no swelling or redness or warmth  Lymphadenopathy:    She has no cervical adenopathy.          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 The etiology of her fatigue and hair loss is not clear. Get labs today

## 2013-03-18 NOTE — Progress Notes (Signed)
Quick Note:  I spoke with pt ______ 

## 2013-03-22 ENCOUNTER — Telehealth: Payer: Self-pay | Admitting: Family Medicine

## 2013-03-22 MED ORDER — DOXYCYCLINE HYCLATE 100 MG PO TABS: 100.0000 mg | ORAL_TABLET | Freq: Two times a day (BID) | ORAL | Status: DC

## 2013-03-22 NOTE — Telephone Encounter (Signed)
Call in Doxycycline 100 mg bid for 10 days  

## 2013-03-22 NOTE — Telephone Encounter (Signed)
I sent script e-scribe and left voice message for pt 

## 2013-03-22 NOTE — Telephone Encounter (Signed)
error 

## 2013-03-22 NOTE — Telephone Encounter (Signed)
PT was here on 03/17/13, and woke up the following day with extreme head congestion, sinus blockage, sneezing, coughing, very sore throat. PT states that she would like to know if she could have an antibiotic, ect? Please assist.

## 2013-03-22 NOTE — Telephone Encounter (Signed)
Patient Information:  Caller Name: Leah Hodges  Phone: [phone]  Patient: Leah Hodges, Leah Hodges  Gender: Female  DOB: 06-23-1941  Age: 72 Years  PCP: Gershon Crane [facility])  Office Follow Up:  Does the office need to follow up with this patient?: Yes  Instructions For The Office: Please notify pt.   Symptoms  Reason For Call & Symptoms: Pt has congestion/nasal drainage/sneezing/sore throat. No fever. She has had chronic problems like this yearly and MD always calls in abx.  Pt is asking if MD can call in something other than a z-pac. Pt does not want to come in (was just there on Wed for an office visit/labs)  Reviewed Health History In EMR: Yes  Reviewed Medications In EMR: Yes  Reviewed Allergies In EMR: Yes  Reviewed Surgeries / Procedures: Yes  Date of Onset of Symptoms: 03/18/2013  Treatments Tried: otc Equate allergy relief q24h; cough syrup/tussin dm  Treatments Tried Worked: No  Guideline(s) Used:  Colds  Disposition Per Guideline:   See Today or Tomorrow in Office  Reason For Disposition Reached:   Using nasal washes and pain medicine > 24 hours and sinus pain (lower forehead, cheekbone, or eye) persists  Advice Given:  N/A  Patient Refused Recommendation:  Patient Requests Prescription  [facility]

## 2013-06-08 ENCOUNTER — Telehealth: Payer: Self-pay | Admitting: Family Medicine

## 2013-06-08 MED ORDER — LISINOPRIL-HYDROCHLOROTHIAZIDE 10-12.5 MG PO TABS: 1.0000 | ORAL_TABLET | Freq: Every day | ORAL | Status: DC

## 2013-06-08 MED ORDER — OMEPRAZOLE 40 MG PO CPDR: 40.0000 mg | DELAYED_RELEASE_CAPSULE | Freq: Every day | ORAL | Status: DC

## 2013-06-08 NOTE — Telephone Encounter (Signed)
Refill request for Lisinopril-HCTZ & Omeprazole and a 90 day supply. I did send scripts e-scribe.

## 2013-06-15 ENCOUNTER — Other Ambulatory Visit: Payer: Self-pay | Admitting: Family Medicine

## 2013-06-16 NOTE — Telephone Encounter (Signed)
Call in #60 with 5 rf 

## 2013-08-13 ENCOUNTER — Other Ambulatory Visit: Payer: Self-pay | Admitting: Family Medicine

## 2013-08-13 NOTE — Telephone Encounter (Signed)
Too soon. We gave her a 6 month supply on 06-15-13

## 2013-08-13 NOTE — Telephone Encounter (Signed)
Pt needs new rx for tramadol only has [facility] pills. [facility] [address]

## 2013-08-15 ENCOUNTER — Other Ambulatory Visit: Payer: Self-pay | Admitting: Family Medicine

## 2013-08-16 MED ORDER — TRAMADOL HCL 50 MG PO TABS: 50.0000 mg | ORAL_TABLET | Freq: Two times a day (BID) | ORAL | Status: DC | PRN

## 2013-08-16 NOTE — Telephone Encounter (Signed)
Per Dr. Clent Ridges, okay to call in again, # 60 with 4 refills. I did call in script.

## 2013-08-16 NOTE — Addendum Note (Signed)
Addended by: Aniceto Boss A on: 08/16/2013 02:56 PM   Modules accepted: Orders

## 2013-08-16 NOTE — Telephone Encounter (Signed)
Pharmacy has these refills, but they will not utilize them due to this medication being changed to a controlled substance. Since the change, the pharmacies will NOT use the refills listed. They are requiring that new RX's be sent. Please assist.

## 2013-10-04 ENCOUNTER — Encounter: Payer: Self-pay | Admitting: Family Medicine

## 2013-10-04 ENCOUNTER — Ambulatory Visit (INDEPENDENT_AMBULATORY_CARE_PROVIDER_SITE_OTHER): Payer: Medicare Other | Admitting: Family Medicine

## 2013-10-04 VITALS — BP 134/72 | HR 80 | Temp 98.4°F | Wt 178.0 lb

## 2013-10-04 DIAGNOSIS — F329 Major depressive disorder, single episode, unspecified: Secondary | ICD-10-CM

## 2013-10-04 DIAGNOSIS — K219 Gastro-esophageal reflux disease without esophagitis: Secondary | ICD-10-CM

## 2013-10-04 MED ORDER — OMEPRAZOLE 40 MG PO CPDR: 40.0000 mg | DELAYED_RELEASE_CAPSULE | Freq: Two times a day (BID) | ORAL | Status: DC

## 2013-10-04 NOTE — Addendum Note (Signed)
Addended by: Gershon Crane A on: 10/04/2013 02:26 PM   Modules accepted: Orders

## 2013-10-04 NOTE — Progress Notes (Signed)
  Subjective:    Patient ID: Leah Hodges, female    DOB: August 19, 1941, 72 y.o.   MRN: [id number]  HPI Here for one month of multiple problems, including chest pains, burning abdominal pains, SOB, high anxiety, tearfulness, and poor sleep. These sx have been almost continuous and are not related to exertion. She has had many family issues lately and she has had lots of arguments with her children.    Review of Systems  Constitutional: Negative.   Respiratory: Positive for chest tightness and shortness of breath. Negative for cough.   Cardiovascular: Positive for chest pain. Negative for palpitations and leg swelling.  Gastrointestinal: Positive for abdominal pain. Negative for nausea, vomiting, diarrhea, constipation, blood in stool and abdominal distention.  Genitourinary: Negative.        Objective:   Physical Exam  Constitutional: She appears well-developed and well-nourished.  Neck: No thyromegaly present.  Cardiovascular: Normal rate, regular rhythm, normal heart sounds and intact distal pulses.   Pulmonary/Chest: Effort normal and breath sounds normal. No respiratory distress. She has no wheezes. She has no rales.  Abdominal: Soft. Bowel sounds are normal. She exhibits no distension and no mass. There is no tenderness. There is no rebound and no guarding.  Lymphadenopathy:    She has no cervical adenopathy.  Psychiatric: Her behavior is normal. Thought content normal.  Anxious, tearful           Assessment & Plan:  Mos of her sx seem to be related to stress, and I think her GERD is acting up. Increase Omeprazole to bid. Recheck prn

## 2013-11-15 ENCOUNTER — Telehealth: Payer: Self-pay | Admitting: Family Medicine

## 2013-11-15 ENCOUNTER — Encounter: Payer: Self-pay | Admitting: Family Medicine

## 2013-11-15 ENCOUNTER — Ambulatory Visit (INDEPENDENT_AMBULATORY_CARE_PROVIDER_SITE_OTHER): Payer: Medicare Other | Admitting: Family Medicine

## 2013-11-15 VITALS — BP 118/74 | HR 81 | Temp 99.1°F | Wt 172.0 lb

## 2013-11-15 DIAGNOSIS — K529 Noninfective gastroenteritis and colitis, unspecified: Secondary | ICD-10-CM

## 2013-11-15 DIAGNOSIS — K5289 Other specified noninfective gastroenteritis and colitis: Secondary | ICD-10-CM

## 2013-11-15 MED ORDER — METRONIDAZOLE 500 MG PO TABS: 500.0000 mg | ORAL_TABLET | Freq: Two times a day (BID) | ORAL | Status: DC

## 2013-11-15 MED ORDER — CIPROFLOXACIN HCL 500 MG PO TABS: 500.0000 mg | ORAL_TABLET | Freq: Two times a day (BID) | ORAL | Status: DC

## 2013-11-15 NOTE — Telephone Encounter (Signed)
Patient Information:  Caller Name: Leah Hodges  Phone: [phone]  Patient: Leah Hodges, Leah Hodges  Gender: Female  DOB: [birth year]  Age: 72 Years  PCP: Gershon Crane [facility])  Office Follow Up:  Does the office need to follow up with this patient?: No  Instructions For The Office: N/A   Symptoms  Reason For Call & Symptoms: Pt started with vomiting on 11/11/13 . Pt vomited 3-4x and then has had severe diarrhea. Pt has had diarrhea since - 8x/day. Pt is drinking fluids. She did urinate this am. Pt has maintained nausea and a painful abdomen. No otc meds have helped.  Reviewed Health History In EMR: Yes  Reviewed Medications In EMR: Yes  Reviewed Allergies In EMR: Yes  Reviewed Surgeries / Procedures: Yes  Date of Onset of Symptoms: 11/15/2013  Guideline(s) Used:  Diarrhea  Disposition Per Guideline:   See Today in Office  Reason For Disposition Reached:   Abdominal pain (Exception: pain clears completely with each passage of diarrhea stool)  Advice Given:  N/A  Patient Will Follow Care Advice:  YES  Appointment Scheduled:  11/15/2013 15:00:00 Appointment Scheduled Provider:  Gershon Crane [facility])

## 2013-11-15 NOTE — Progress Notes (Signed)
   Subjective:    Patient ID: Leah Hodges, female    DOB: Jun 06, 1941, 72 y.o.   MRN: [id number]  HPI Here for 3 days of nausea and vomiting with a lot of diarrhea and low grade fevers. Not much abdominal pain. This started shortly after eating a meal of take out barbecue and cole slaw.    Review of Systems  Constitutional: Positive for fever.  Gastrointestinal: Positive for nausea, vomiting and diarrhea. Negative for abdominal pain, constipation, blood in stool and abdominal distention.       Objective:   Physical Exam  Constitutional: She appears well-developed and well-nourished.  Cardiovascular: Normal rate, regular rhythm, normal heart sounds and intact distal pulses.   Pulmonary/Chest: Effort normal and breath sounds normal.  Abdominal: Soft. Bowel sounds are normal. She exhibits no distension and no mass. There is no rebound and no guarding.  Mildly tender diffusely          Assessment & Plan:  Enteritis probably from contaminated food. Given Flagyl and Cipro. Drink plenty of fluids

## 2013-12-10 ENCOUNTER — Other Ambulatory Visit: Payer: Self-pay | Admitting: Family Medicine

## 2013-12-13 NOTE — Telephone Encounter (Signed)
Call in #60 with 5 rf 

## 2013-12-13 NOTE — Telephone Encounter (Signed)
[facility] [address]. Pt states she is out and needs asap. traMADol (ULTRAM) 50 MG tablet

## 2013-12-14 NOTE — Telephone Encounter (Signed)
Please call this in  

## 2013-12-28 ENCOUNTER — Telehealth: Payer: Self-pay | Admitting: Family Medicine

## 2013-12-28 NOTE — Telephone Encounter (Signed)
[facility] Triage Call Report Triage Record Num: [id number] Operator: Nancie Neas Patient Name: Leah Hodges Call Date & Time: 12/14/2013 5:03:25PM Patient Phone: [phone] PCP: Ishmael Holter. Sarajane Jews Patient Gender: Female PCP Fax : [phone] Patient DOB: 09-20-1941 Practice Name: [facility] Reason for Call: Caller: [name]/Patient; PCP: [name] (Family Practice); CB#: [phone]; Today, 12/14/2013, pt calling stating she takes Tramadol BID and noticed last month that she was short 21 tablets , but she was able to refill. Pt stating she has 39 tablets left and [facility] [address] , that it can be refilled 12/29/2013. Pt is wanting it fixed where she can get it filled at the first of the month with her other medications. RN advised to call office back when they reopen concerning her request. Protocol(s) Used: Office Note Recommended Outcome per Protocol: Information Noted and Sent to Office Reason for Outcome: Caller information to office Care

## 2013-12-29 ENCOUNTER — Other Ambulatory Visit: Payer: Self-pay | Admitting: Family Medicine

## 2014-01-27 ENCOUNTER — Telehealth: Payer: Self-pay | Admitting: Family Medicine

## 2014-01-27 NOTE — Telephone Encounter (Signed)
I spoke with pharmacy and pt does have refills left and I spoke with pt.

## 2014-01-27 NOTE — Telephone Encounter (Signed)
Pt states the pharm told her she must call in each month for her traMADol (ULTRAM) 50 MG tablet even though she has refills on her rx.  Pt needs another refill and asked if this is not true, could you inform the pharm please [facility] [address]

## 2014-01-28 ENCOUNTER — Encounter: Payer: Self-pay | Admitting: Gastroenterology

## 2014-03-30 ENCOUNTER — Encounter: Payer: Self-pay | Admitting: Family Medicine

## 2014-03-30 ENCOUNTER — Ambulatory Visit (INDEPENDENT_AMBULATORY_CARE_PROVIDER_SITE_OTHER): Payer: Medicare Other | Admitting: Family Medicine

## 2014-03-30 ENCOUNTER — Encounter: Payer: Self-pay | Admitting: *Deleted

## 2014-03-30 VITALS — BP 150/80 | Temp 98.6°F | Ht 63.0 in | Wt 177.0 lb

## 2014-03-30 DIAGNOSIS — N3281 Overactive bladder: Secondary | ICD-10-CM | POA: Insufficient documentation

## 2014-03-30 DIAGNOSIS — N318 Other neuromuscular dysfunction of bladder: Secondary | ICD-10-CM

## 2014-03-30 DIAGNOSIS — G4761 Periodic limb movement disorder: Secondary | ICD-10-CM

## 2014-03-30 DIAGNOSIS — R350 Frequency of micturition: Secondary | ICD-10-CM

## 2014-03-30 LAB — POCT URINALYSIS DIPSTICK
Bilirubin, UA: NEGATIVE
Blood, UA: NEGATIVE
Glucose, UA: NEGATIVE
KETONES UA: NEGATIVE
Nitrite, UA: NEGATIVE
SPEC GRAV UA: 1.025
UROBILINOGEN UA: 0.2
pH, UA: 6.5

## 2014-03-30 MED ORDER — PRAMIPEXOLE DIHYDROCHLORIDE 0.5 MG PO TABS: 0.5000 mg | ORAL_TABLET | Freq: Every day | ORAL | Status: DC

## 2014-03-30 MED ORDER — OXYBUTYNIN CHLORIDE ER 15 MG PO TB24: 15.0000 mg | ORAL_TABLET | Freq: Every day | ORAL | Status: DC

## 2014-03-30 NOTE — Progress Notes (Signed)
   Subjective:    Patient ID: Leah Hodges, female    DOB: 1940/12/15, 73 y.o.   MRN: [id number]  HPI Here for 2 things. First she is having more and more trouble with urinary incontinence, and this happens anytime of the day or night. She often feels a sudden urge to go and then leaks some urine. No burning. Also she has trouble sleeping especially at the beginning of her night due to uncontrollable jerks of her arms or legs. This does not happen during the day.    Review of Systems  Constitutional: Negative.   Genitourinary: Positive for urgency. Negative for dysuria, frequency, flank pain and difficulty urinating.       Objective:   Physical Exam  Constitutional: She is oriented to person, place, and time. She appears well-developed and well-nourished.  Abdominal: Soft. Bowel sounds are normal. She exhibits no distension and no mass. There is no tenderness. There is no rebound and no guarding.  Neurological: She is alert and oriented to person, place, and time.          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 We can titrate up on the dose if needed. For the OAB, we will rule out infection and try Oxybutynin 15 mg daily.

## 2014-03-30 NOTE — Progress Notes (Signed)
Pre visit review using our clinic review tool, if applicable. No additional management support is needed unless otherwise documented below in the visit note. 

## 2014-04-01 LAB — URINE CULTURE: Colony Count: 9000

## 2014-04-28 ENCOUNTER — Encounter: Payer: Self-pay | Admitting: Family Medicine

## 2014-05-13 ENCOUNTER — Ambulatory Visit (INDEPENDENT_AMBULATORY_CARE_PROVIDER_SITE_OTHER): Payer: Medicare Other | Admitting: Family Medicine

## 2014-05-13 ENCOUNTER — Encounter: Payer: Self-pay | Admitting: Family Medicine

## 2014-05-13 VITALS — BP 145/85 | HR 63 | Temp 98.3°F | Ht 63.0 in | Wt 175.0 lb

## 2014-05-13 DIAGNOSIS — I10 Essential (primary) hypertension: Secondary | ICD-10-CM

## 2014-05-13 DIAGNOSIS — Z0000 Encounter for general adult medical examination without abnormal findings: Secondary | ICD-10-CM

## 2014-05-13 LAB — HEPATIC FUNCTION PANEL
ALBUMIN: 4 g/dL (ref 3.5–5.2)
ALK PHOS: 108 U/L (ref 39–117)
ALT: 26 U/L (ref 0–35)
AST: 21 U/L (ref 0–37)
Bilirubin, Direct: 0.1 mg/dL (ref 0.0–0.3)
Total Bilirubin: 0.4 mg/dL (ref 0.2–1.2)
Total Protein: 7.2 g/dL (ref 6.0–8.3)

## 2014-05-13 LAB — POCT URINALYSIS DIPSTICK
GLUCOSE UA: NEGATIVE
NITRITE UA: NEGATIVE
SPEC GRAV UA: 1.025
Urobilinogen, UA: 0.2
pH, UA: 6

## 2014-05-13 LAB — CBC WITH DIFFERENTIAL/PLATELET
BASOS PCT: 0.5 % (ref 0.0–3.0)
Basophils Absolute: 0 10*3/uL (ref 0.0–0.1)
EOS PCT: 0.7 % (ref 0.0–5.0)
Eosinophils Absolute: 0.1 10*3/uL (ref 0.0–0.7)
HCT: 39.2 % (ref 36.0–46.0)
Hemoglobin: 13 g/dL (ref 12.0–15.0)
Lymphocytes Relative: 25.5 % (ref 12.0–46.0)
Lymphs Abs: 2.4 10*3/uL (ref 0.7–4.0)
MCHC: 33.2 g/dL (ref 30.0–36.0)
MCV: 86 fl (ref 78.0–100.0)
MONO ABS: 0.6 10*3/uL (ref 0.1–1.0)
Monocytes Relative: 6.5 % (ref 3.0–12.0)
NEUTROS ABS: 6.3 10*3/uL (ref 1.4–7.7)
NEUTROS PCT: 66.8 % (ref 43.0–77.0)
Platelets: 258 10*3/uL (ref 150.0–400.0)
RBC: 4.56 Mil/uL (ref 3.87–5.11)
RDW: 14.3 % (ref 11.5–15.5)
WBC: 9.4 10*3/uL (ref 4.0–10.5)

## 2014-05-13 LAB — TSH: TSH: 0.93 u[IU]/mL (ref 0.35–4.50)

## 2014-05-13 LAB — LIPID PANEL
Cholesterol: 224 mg/dL — ABNORMAL HIGH (ref 0–200)
HDL: 64.1 mg/dL (ref >39.00)
LDL Cholesterol: 143 mg/dL — ABNORMAL HIGH (ref 0–99)
NonHDL: 159.9
TRIGLYCERIDES: 85 mg/dL (ref 0.0–149.0)
Total CHOL/HDL Ratio: 3
VLDL: 17 mg/dL (ref 0.0–40.0)

## 2014-05-13 LAB — BASIC METABOLIC PANEL
BUN: 18 mg/dL (ref 6–23)
CO2: 29 mEq/L (ref 19–32)
Calcium: 9.3 mg/dL (ref 8.4–10.5)
Chloride: 105 mEq/L (ref 96–112)
Creatinine, Ser: 0.8 mg/dL (ref 0.4–1.2)
GFR: 70.4 mL/min (ref >60.00)
Glucose, Bld: 100 mg/dL — ABNORMAL HIGH (ref 70–99)
Potassium: 3.5 mEq/L (ref 3.5–5.1)
Sodium: 141 mEq/L (ref 135–145)

## 2014-05-13 MED ORDER — LISINOPRIL-HYDROCHLOROTHIAZIDE 10-12.5 MG PO TABS: ORAL_TABLET | ORAL | Status: DC

## 2014-05-13 MED ORDER — TRAMADOL HCL 50 MG PO TABS: ORAL_TABLET | ORAL | Status: DC

## 2014-05-13 NOTE — Progress Notes (Signed)
Pre visit review using our clinic review tool, if applicable. No additional management support is needed unless otherwise documented below in the visit note. 

## 2014-05-13 NOTE — Progress Notes (Signed)
   Subjective:    Patient ID: Leah Hodges, female    DOB: 1941-11-05, 73 y.o.   MRN: [id number]  HPI 73 yr old female for a cpx. She is doing well except for knee pains. 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 These helped for awhile but now the pain has returned. She even has pain in the left knee now, and this is probably from compensating for the right knee. She will see Dr. Estanislado Pandy in a few week.    Review of Systems  Constitutional: Negative.   HENT: Negative.   Eyes: Negative.   Respiratory: Negative.   Cardiovascular: Negative.   Gastrointestinal: Negative.   Genitourinary: Negative for dysuria, urgency, frequency, hematuria, flank pain, decreased urine volume, enuresis, difficulty urinating, pelvic pain and dyspareunia.  Musculoskeletal: Positive for arthralgias and gait problem. Negative for back pain, joint swelling, myalgias, neck pain and neck stiffness.  Skin: Negative.   Neurological: Negative.   Psychiatric/Behavioral: Negative.        Objective:   Physical Exam  Constitutional: She is oriented to person, place, and time. She appears well-developed and well-nourished. No distress.  HENT:  Head: Normocephalic and atraumatic.  Right Ear: External ear normal.  Left Ear: External ear normal.  Nose: Nose normal.  Mouth/Throat: Oropharynx is clear and moist. No oropharyngeal exudate.  Eyes: Conjunctivae and EOM are normal. Pupils are equal, round, and reactive to light. No scleral icterus.  Neck: Normal range of motion. Neck supple. No JVD present. No thyromegaly present.  Cardiovascular: Normal rate, regular rhythm, normal heart sounds and intact distal pulses.  Exam reveals no gallop and no friction rub.   No murmur heard. EKG normal   Pulmonary/Chest: Effort normal and breath sounds normal. No respiratory distress. She has no wheezes. She has no rales. She exhibits no tenderness.  Abdominal: Soft. Bowel sounds  are normal. She exhibits no distension and no mass. There is no tenderness. There is no rebound and no guarding.  Musculoskeletal: Normal range of motion. She exhibits no edema and no tenderness.  Lymphadenopathy:    She has no cervical adenopathy.  Neurological: She is alert and oriented to person, place, and time. She has normal reflexes. No cranial nerve deficit. She exhibits normal muscle tone. Coordination normal.  Skin: Skin is warm and dry. No rash noted. No erythema.  Psychiatric: She has a normal mood and affect. Her behavior is normal. Judgment and thought content normal.          Assessment & Plan:  Well exam. Get fasting labs.

## 2014-05-16 ENCOUNTER — Telehealth: Payer: Self-pay | Admitting: Family Medicine

## 2014-05-16 NOTE — Telephone Encounter (Signed)
Relevant patient education mailed to patient.  

## 2014-06-06 ENCOUNTER — Encounter: Payer: Self-pay | Admitting: Family Medicine

## 2014-06-06 ENCOUNTER — Ambulatory Visit (INDEPENDENT_AMBULATORY_CARE_PROVIDER_SITE_OTHER): Payer: Medicare Other | Admitting: Family Medicine

## 2014-06-06 VITALS — BP 110/70 | HR 77 | Temp 98.0°F | Ht 63.0 in | Wt 172.0 lb

## 2014-06-06 DIAGNOSIS — R51 Headache: Secondary | ICD-10-CM

## 2014-06-06 DIAGNOSIS — N390 Urinary tract infection, site not specified: Secondary | ICD-10-CM

## 2014-06-06 MED ORDER — SULFAMETHOXAZOLE-TMP DS 800-160 MG PO TABS: 1.0000 | ORAL_TABLET | Freq: Two times a day (BID) | ORAL | Status: DC

## 2014-06-06 MED ORDER — KETOROLAC TROMETHAMINE 60 MG/2ML IM SOLN
60.0000 mg | Freq: Once | INTRAMUSCULAR | Status: AC
  Administered 2014-06-06: 60 mg via INTRAMUSCULAR

## 2014-06-06 NOTE — Progress Notes (Signed)
Pre visit review using our clinic review tool, if applicable. No additional management support is needed unless otherwise documented below in the visit note. 

## 2014-06-06 NOTE — Addendum Note (Signed)
Addended by: Monico Blitz T on: 06/06/2014 04:55 PM   Modules accepted: Orders

## 2014-06-06 NOTE — Progress Notes (Signed)
   Subjective:    Patient ID: Leah Hodges, female    DOB: 06-Oct-1941, 73 y.o.   MRN: [id number]  HPI Here for one week of lower abdominal pressure, urgency to urinate, and diffuse body aches. No fever. She has had a headache for 2 days. No diarrhea or vomiting.    Review of Systems  Constitutional: Negative.   Gastrointestinal: Positive for abdominal pain. Negative for nausea, vomiting, diarrhea, constipation, blood in stool, abdominal distention and rectal pain.  Genitourinary: Positive for dysuria, urgency and frequency.  Neurological: Positive for headaches.       Objective:   Physical Exam  Constitutional: She is oriented to person, place, and time. She appears well-developed and well-nourished.  Cardiovascular: Normal rate, regular rhythm, normal heart sounds and intact distal pulses.   Pulmonary/Chest: Effort normal and breath sounds normal.  Abdominal: Soft. Bowel sounds are normal. She exhibits no distension and no mass. There is no tenderness. There is no rebound and no guarding.  Neurological: She is alert and oriented to person, place, and time.          Assessment & Plan:  Given a Toradol shot for the HA. Treat with Bactrim DS and drink lots of water.

## 2014-07-18 ENCOUNTER — Telehealth: Payer: Self-pay | Admitting: Family Medicine

## 2014-07-18 MED ORDER — METRONIDAZOLE 500 MG PO TABS: 500.0000 mg | ORAL_TABLET | Freq: Three times a day (TID) | ORAL | Status: DC

## 2014-07-18 NOTE — Telephone Encounter (Signed)
Pt states she is having diarrhea like she was having in June.  She didn't want to make an appointment just have Dr. Sarajane Jews call in an antibiotic. Please advise

## 2014-07-18 NOTE — Telephone Encounter (Signed)
I sent script e-scribe and spoke with pt. 

## 2014-07-18 NOTE — Telephone Encounter (Signed)
Call in Flagyl 500 mg tid for 10 days

## 2014-08-11 ENCOUNTER — Encounter: Payer: Self-pay | Admitting: Gastroenterology

## 2014-08-30 ENCOUNTER — Encounter: Payer: Self-pay | Admitting: Gastroenterology

## 2014-09-27 ENCOUNTER — Ambulatory Visit (INDEPENDENT_AMBULATORY_CARE_PROVIDER_SITE_OTHER): Payer: Medicare Other | Admitting: Family Medicine

## 2014-09-27 ENCOUNTER — Encounter: Payer: Self-pay | Admitting: Family Medicine

## 2014-09-27 VITALS — BP 141/76 | HR 77 | Temp 97.9°F | Ht 63.0 in | Wt 162.0 lb

## 2014-09-27 DIAGNOSIS — F419 Anxiety disorder, unspecified: Secondary | ICD-10-CM

## 2014-09-27 DIAGNOSIS — J069 Acute upper respiratory infection, unspecified: Secondary | ICD-10-CM

## 2014-09-27 MED ORDER — LORAZEPAM 1 MG PO TABS: 1.0000 mg | ORAL_TABLET | Freq: Three times a day (TID) | ORAL | Status: DC | PRN

## 2014-09-27 NOTE — Progress Notes (Signed)
Pre visit review using our clinic review tool, if applicable. No additional management support is needed unless otherwise documented below in the visit note. 

## 2014-09-27 NOTE — Progress Notes (Signed)
   Subjective:    Patient ID: Leah Hodges, female    DOB: 31-Aug-1941, 73 y.o.   MRN: [id number]  HPI Here for 2 things. First she has had a stuffy nose, ST, and a dry cough for 3 days. No fever. Drinking fluids. Also she has been very stressed with a difficult situation in her home. Her son recently had a string of bad luck causing him to lose his truck, his home, and his job. He is now living with her on no income, and she is strapped financially. He is also apparently depressed and he takes this out on her. She feels anxious all the time, can't relax and can't sleep.    Review of Systems  Constitutional: Negative.   HENT: Positive for congestion and postnasal drip.   Eyes: Negative.   Respiratory: Positive for cough.   Psychiatric/Behavioral: Positive for dysphoric mood. Negative for confusion, decreased concentration and agitation. The patient is nervous/anxious.        Objective:   Physical Exam  Constitutional: She appears well-developed and well-nourished.  HENT:  Right Ear: External ear normal.  Left Ear: External ear normal.  Nose: Nose normal.  Mouth/Throat: Oropharynx is clear and moist.  Eyes: Conjunctivae are normal.  Pulmonary/Chest: Effort normal and breath sounds normal.  Lymphadenopathy:    She has no cervical adenopathy.  Psychiatric: Her behavior is normal. Thought content normal.  Tearful           Assessment & Plan:  For the URI try Robitussin and Flonase nasal sprays. Try Ativan prn for the anxiety.

## 2014-11-02 ENCOUNTER — Ambulatory Visit (INDEPENDENT_AMBULATORY_CARE_PROVIDER_SITE_OTHER): Payer: Medicare Other | Admitting: Gastroenterology

## 2014-11-02 ENCOUNTER — Encounter: Payer: Self-pay | Admitting: Gastroenterology

## 2014-11-02 VITALS — BP 112/58 | HR 72 | Ht 62.0 in | Wt 163.1 lb

## 2014-11-02 DIAGNOSIS — R198 Other specified symptoms and signs involving the digestive system and abdomen: Secondary | ICD-10-CM

## 2014-11-02 DIAGNOSIS — R0989 Other specified symptoms and signs involving the circulatory and respiratory systems: Secondary | ICD-10-CM

## 2014-11-02 DIAGNOSIS — J3489 Other specified disorders of nose and nasal sinuses: Secondary | ICD-10-CM

## 2014-11-02 DIAGNOSIS — R05 Cough: Secondary | ICD-10-CM

## 2014-11-02 DIAGNOSIS — R059 Cough, unspecified: Secondary | ICD-10-CM

## 2014-11-02 DIAGNOSIS — K219 Gastro-esophageal reflux disease without esophagitis: Secondary | ICD-10-CM

## 2014-11-02 DIAGNOSIS — R6889 Other general symptoms and signs: Secondary | ICD-10-CM

## 2014-11-02 NOTE — Patient Instructions (Signed)
Follow up with Dr Sarajane Jews for your sinus symptoms

## 2014-11-02 NOTE — Progress Notes (Signed)
History of Present Illness: This is a 73 year old female with chronic GERD that is well controlled on omeprazole 40 mg twice daily. She complains of a runny nose, sinus drainage, phlegm in her throat, frequent coughing, occasionally choking related to phlegm and coughing. Her symptoms have been bothersome for years but worsened over the past few months. She has no solid or liquid dysphagia.  No Known Allergies Outpatient Prescriptions Prior to Visit  Medication Sig Dispense Refill  . aspirin 81 MG tablet Take 81 mg by mouth daily.    . Cholecalciferol (VITAMIN D) 2000 UNITS tablet Take 2,000 Units by mouth daily.      . fish oil-omega-3 fatty acids 1000 MG capsule Take 2 g by mouth daily.      Marland Kitchen lisinopril-hydrochlorothiazide (PRINZIDE,ZESTORETIC) 10-12.5 MG per tablet TAKE 1 TABLET BY MOUTH DAILY. 90 tablet 3  . omeprazole (PRILOSEC) 40 MG capsule Take 1 capsule (40 mg total) by mouth 2 (two) times daily. 180 capsule 3  . traMADol (ULTRAM) 50 MG tablet TAKE 1 TABLET BY MOUTH TWICE A DAY 360 tablet 1  . LORazepam (ATIVAN) 1 MG tablet Take 1 tablet (1 mg total) by mouth every 8 (eight) hours as needed for anxiety. 60 tablet 2  . metroNIDAZOLE (FLAGYL) 500 MG tablet Take 1 tablet (500 mg total) by mouth 3 (three) times daily. 30 tablet 0  . oxybutynin (DITROPAN XL) 15 MG 24 hr tablet Take 1 tablet (15 mg total) by mouth at bedtime. 30 tablet 5  . sulfamethoxazole-trimethoprim (BACTRIM DS) 800-160 MG per tablet Take 1 tablet by mouth 2 (two) times daily. 20 tablet 0   No facility-administered medications prior to visit.   Past Medical History  Diagnosis Date  . Allergic rhinitis   . GERD (gastroesophageal reflux disease)   . Hypertension   . Depression   . Polymyalgia rheumatica     sees Dr. Estanislado Pandy   . Hyperlipidemia   . Diverticulosis   . Obesity   . Sleep apnea   . Hemorrhoids   . Osteoarthritis     sees Dr. Estanislado Pandy   . Resting tremor     sees Dr. Floyde Parkins   .  Restless legs     sees Dr. Floyde Parkins   . Paralysis agitans     sees Dr, Floyde Parkins    Past Surgical History  Procedure Laterality Date  . Cholecystectomy    . Tonsillectomy and adenoidectomy    . Knee surgery  03/2007    Left knee for torn ligamints  . Replacement total knee Left 2007    per Dr. Ronnie Derby   . Vaginal hysterectomy    . Salpingoophorectomy    . Rotator cuff repair    . Esophagogastroduodenoscopy (egd) with esophageal dilation  10-12-10    per Dr. Fuller Plan  . Colonoscopy  03-27-09    per Dr. Fuller Plan, mild colitis and diverticulosis, repeat in 5 yrs    History   Social History  . Marital Status: Single    Spouse Name: N/A    Number of Children: N/A  . Years of Education: N/A   Occupational History  . PT [employer]    Social History Main Topics  . Smoking status: Never Smoker   . Smokeless tobacco: Never Used  . Alcohol Use: No  . Drug Use: No  . Sexual Activity: None   Other Topics Concern  . None   Social History Narrative   Family History  Problem Relation Age of Onset  .  Alcohol abuse    . Alzheimer's disease Mother   . Hypertension    . Stroke    . Colon polyps Mother     in her 2's  . Colon cancer Neg Hx      Review of Systems: Pertinent positive and negative review of systems were noted in the above HPI section. All other review of systems were otherwise negative.   Physical Exam: General: Well developed , well nourished, no acute distress Head: Normocephalic and atraumatic Eyes:  sclerae anicteric, EOMI Ears: Normal auditory acuity Mouth: No deformity or lesions Neck: Supple, no masses or thyromegaly Lungs: Clear throughout to auscultation Heart: Regular rate and rhythm; no murmurs, rubs or bruits Abdomen: Soft, non tender and non distended. No masses, hepatosplenomegaly or hernias noted. Normal Bowel sounds Musculoskeletal: Symmetrical with no gross deformities  Skin: No lesions on visible extremities Pulses:  Normal pulses  noted Extremities: No clubbing, cyanosis, edema or deformities noted Neurological: Alert oriented x 4, grossly nonfocal Cervical Nodes:  No significant cervical adenopathy Inguinal Nodes: No significant inguinal adenopathy Psychological:  Alert and cooperative. Normal mood and affect  Assessment and Recommendations:  1. Sinus drainage, cough, phlegm, throat clearing. Although she has chronic GERD the symptoms appear to be sinus and allergy related. I have advised her to return to Dr. Sarajane Jews for further evaluation.  2. Chronic GERD. Continue standard antireflux measures and omeprazole 40 mg twice daily.

## 2014-11-26 ENCOUNTER — Other Ambulatory Visit: Payer: Self-pay | Admitting: Family Medicine

## 2014-11-28 ENCOUNTER — Telehealth: Payer: Self-pay | Admitting: Family Medicine

## 2014-11-28 NOTE — Telephone Encounter (Signed)
Called to the pharmacy and left on machine. 

## 2014-11-28 NOTE — Telephone Encounter (Signed)
Pt has only 1 tramadol pill left. Pt is aware may take up to 3 business day for refills. [facility] [address]

## 2014-11-28 NOTE — Telephone Encounter (Signed)
We did this earlier today  

## 2014-11-28 NOTE — Telephone Encounter (Signed)
Call in #[phone] with one rf 

## 2015-02-02 DIAGNOSIS — M19041 Primary osteoarthritis, right hand: Secondary | ICD-10-CM | POA: Diagnosis not present

## 2015-02-02 DIAGNOSIS — M542 Cervicalgia: Secondary | ICD-10-CM | POA: Diagnosis not present

## 2015-02-02 DIAGNOSIS — G4701 Insomnia due to medical condition: Secondary | ICD-10-CM | POA: Diagnosis not present

## 2015-02-02 DIAGNOSIS — M353 Polymyalgia rheumatica: Secondary | ICD-10-CM | POA: Diagnosis not present

## 2015-02-03 ENCOUNTER — Encounter: Payer: Self-pay | Admitting: Family Medicine

## 2015-02-03 ENCOUNTER — Ambulatory Visit (INDEPENDENT_AMBULATORY_CARE_PROVIDER_SITE_OTHER): Payer: Medicare Other | Admitting: Family Medicine

## 2015-02-03 VITALS — BP 137/81 | HR 64 | Temp 99.0°F | Ht 62.0 in | Wt 160.0 lb

## 2015-02-03 DIAGNOSIS — N3281 Overactive bladder: Secondary | ICD-10-CM

## 2015-02-03 DIAGNOSIS — M546 Pain in thoracic spine: Secondary | ICD-10-CM

## 2015-02-03 MED ORDER — OXYBUTYNIN CHLORIDE ER 10 MG PO TB24: 10.0000 mg | ORAL_TABLET | Freq: Every day | ORAL | Status: DC

## 2015-02-03 MED ORDER — GABAPENTIN 100 MG PO CAPS: 100.0000 mg | ORAL_CAPSULE | Freq: Three times a day (TID) | ORAL | Status: DC

## 2015-02-03 NOTE — Progress Notes (Signed)
   Subjective:    Patient ID: YUE FLANIGAN, female    DOB: 01-12-1941, 74 y.o.   MRN: [id number]  HPI Here for one month of pain in the left upper back which includes burning, itching, and numbness. She showed this to Dr. Estanislado Pandy earlier this week and she gave her a cortisone shot in the area, but this has not helped. Also she has had more trouble with urinary incontinence. No burning or discomfort.    Review of Systems  Constitutional: Negative.   Respiratory: Negative.   Cardiovascular: Negative.   Musculoskeletal: Positive for back pain.       Objective:   Physical Exam  Constitutional: She appears well-developed and well-nourished.  Cardiovascular: Normal rate, regular rhythm, normal heart sounds and intact distal pulses.   Pulmonary/Chest: Effort normal and breath sounds normal.  Abdominal: Soft. Bowel sounds are normal. She exhibits no distension and no mass. There is no tenderness. There is no rebound and no guarding.  Musculoskeletal:  She is tender in the left upper back between the scapula and the spine          Assessment & Plan:  Try Oxybutynin for the OAB and try gabapentin for the pinched nerve in his back.

## 2015-02-03 NOTE — Progress Notes (Signed)
Pre visit review using our clinic review tool, if applicable. No additional management support is needed unless otherwise documented below in the visit note. 

## 2015-02-23 DIAGNOSIS — Z961 Presence of intraocular lens: Secondary | ICD-10-CM | POA: Diagnosis not present

## 2015-02-23 DIAGNOSIS — H04123 Dry eye syndrome of bilateral lacrimal glands: Secondary | ICD-10-CM | POA: Diagnosis not present

## 2015-04-04 ENCOUNTER — Other Ambulatory Visit: Payer: Self-pay | Admitting: Family Medicine

## 2015-06-23 ENCOUNTER — Other Ambulatory Visit: Payer: Self-pay | Admitting: Family Medicine

## 2015-07-13 DIAGNOSIS — M170 Bilateral primary osteoarthritis of knee: Secondary | ICD-10-CM | POA: Diagnosis not present

## 2015-07-13 DIAGNOSIS — M542 Cervicalgia: Secondary | ICD-10-CM | POA: Diagnosis not present

## 2015-07-13 DIAGNOSIS — M19041 Primary osteoarthritis, right hand: Secondary | ICD-10-CM | POA: Diagnosis not present

## 2015-07-13 DIAGNOSIS — M810 Age-related osteoporosis without current pathological fracture: Secondary | ICD-10-CM | POA: Diagnosis not present

## 2015-07-13 DIAGNOSIS — M353 Polymyalgia rheumatica: Secondary | ICD-10-CM | POA: Diagnosis not present

## 2015-07-24 ENCOUNTER — Encounter: Payer: Self-pay | Admitting: Family Medicine

## 2015-07-24 ENCOUNTER — Ambulatory Visit (INDEPENDENT_AMBULATORY_CARE_PROVIDER_SITE_OTHER): Payer: Medicare Other | Admitting: Family Medicine

## 2015-07-24 VITALS — BP 133/67 | HR 61 | Temp 98.2°F | Ht 62.0 in | Wt 163.0 lb

## 2015-07-24 DIAGNOSIS — N3281 Overactive bladder: Secondary | ICD-10-CM | POA: Diagnosis not present

## 2015-07-24 DIAGNOSIS — G20 Parkinson's disease: Secondary | ICD-10-CM

## 2015-07-24 DIAGNOSIS — G20A1 Parkinson's disease without dyskinesia, without mention of fluctuations: Secondary | ICD-10-CM

## 2015-07-24 MED ORDER — SOLIFENACIN SUCCINATE 10 MG PO TABS: 10.0000 mg | ORAL_TABLET | Freq: Every day | ORAL | Status: DC

## 2015-07-24 NOTE — Progress Notes (Signed)
   Subjective:    Patient ID: Leah Hodges, female    DOB: 1941-10-26, 74 y.o.   MRN: [id number]  HPI Here to follow up on issues. Her OAB has been acting up despite taking Ditropan, and she dislikes this because it makes her sleepy. Also her tremors and poor balance have been getting worse. She saw [name] 3 years ago and was diagnosed with early Parkinsons. He offered to treat this but she declined at that time, partly because she questioned the diagnosis. Now these symptoms are getting worse and she desires treatment for them.    Review of Systems  Respiratory: Negative.   Cardiovascular: Negative.   Genitourinary: Positive for urgency and frequency. Negative for dysuria.  Musculoskeletal: Positive for gait problem.  Neurological: Positive for tremors and weakness.       Objective:   Physical Exam  Constitutional: She is oriented to person, place, and time. She appears well-developed and well-nourished.  Cardiovascular: Normal rate, regular rhythm, normal heart sounds and intact distal pulses.   Pulmonary/Chest: Effort normal and breath sounds normal.  Abdominal: Soft. Bowel sounds are normal. She exhibits no distension and no mass. There is no tenderness. There is no rebound and no guarding.  Neurological: She is alert and oriented to person, place, and time. She has normal reflexes. No cranial nerve deficit. She exhibits normal muscle tone.  She has resting tremors in both hands. Her gait is a bit shuffling and her facial features are somewhat flat           Assessment & Plan:  For the OAB we will stop Ditropan and try Vesicare. I told her that she does show classic signs of a Parkinson syndrome and it does need to be treated. We will refer her to Dr. Carles Collet for this.

## 2015-07-24 NOTE — Progress Notes (Signed)
Pre visit review using our clinic review tool, if applicable. No additional management support is needed unless otherwise documented below in the visit note. 

## 2015-08-02 ENCOUNTER — Ambulatory Visit (INDEPENDENT_AMBULATORY_CARE_PROVIDER_SITE_OTHER): Payer: Medicare Other | Admitting: Neurology

## 2015-08-02 ENCOUNTER — Encounter: Payer: Self-pay | Admitting: Neurology

## 2015-08-02 VITALS — BP 124/82 | HR 60 | Ht 63.0 in | Wt 163.0 lb

## 2015-08-02 DIAGNOSIS — G20 Parkinson's disease: Secondary | ICD-10-CM | POA: Diagnosis not present

## 2015-08-02 MED ORDER — CARBIDOPA-LEVODOPA 25-100 MG PO TABS: 1.0000 | ORAL_TABLET | Freq: Three times a day (TID) | ORAL | Status: DC

## 2015-08-02 NOTE — Progress Notes (Signed)
Leah Hodges was seen today in the movement disorders clinic for neurologic consultation at the request of FRY,STEPHEN A, MD.   The patient is seen today in neurologic consultation for possible Parkinson's disease.  The patient saw [name] previously for the same.  She last saw him in 2013.  I reviewed his records.  [name] first saw the patient in November, 2012 and at that point the patient had reported tremor had been going on for 1 year in her left hand.  [name] felt that the patient had Parkinson's disease and was told to try Benadryl for the tremor.  She did not follow up until 02/12/2012.  She had not tried the Benadryl and he recommended that she start Requip XL 2 mg.  She did not do that.  She doesn't remember being given medication.  She admits that she has been in denial.  She states that tremor is in both hands now (she actually thought that it started in right hand but couldn't remember that far back).  No fam hx of PD.     Specific Symptoms:  Tremor: Yes.   Voice: doesn't think that it has changed but admits that it is "husky" in the AM Sleep: states that cats keep her up  Vivid Dreams: some but not alot  Acting out dreams:  Yes.  , just woke up screaming for her deceased father Wet Pillows: Yes.   Postural symptoms:  Yes.    Falls?  No. Bradykinesia symptoms: slow movements and difficulty getting out of a chair Loss of smell:  No. Loss of taste:  Yes.   Urinary Incontinence:  Yes.   (urinary urgency, wears pad) Difficulty Swallowing:  Rarely and associates with phlegm in the AM but otherwise okay Handwriting, micrographia: Yes.   (right hand dominant) Trouble with ADL's:  No.  Trouble buttoning clothing: No. Depression:  Yes.  ; lots of stress caregiving for her son who lives with her and has medical issues;  Memory changes:  Yes.  , trouble with word finding difficulties over the last few months Hallucinations:  No.  visual distortions: Yes.   N/V:   No. Lightheaded:  Yes.   (occasionally when gets up)  Syncope: No. Diplopia:  No. Dyskinesia:  No.  Neuroimaging has previously been performed.  It is available for my review today.  It was done 10/24/2011.  I reviewed it with the patient.  There is mild-mod small vessel disease.  Pt has a hx of HTN/hyperlipidemia.  PREVIOUS MEDICATIONS: none to date  ALLERGIES:  No Known Allergies  CURRENT MEDICATIONS:  Outpatient Encounter Prescriptions as of 08/02/2015  Medication Sig  . aspirin 81 MG tablet Take 81 mg by mouth daily.  Marland Kitchen CALCIUM-MAGNESIUM-ZINC PO Take by mouth daily.  . Cholecalciferol (VITAMIN D) 2000 UNITS tablet Take 2,000 Units by mouth daily.    . fish oil-omega-3 fatty acids 1000 MG capsule Take 2 g by mouth daily.    Marland Kitchen gabapentin (NEURONTIN) 100 MG capsule Take 1 capsule (100 mg total) by mouth 3 (three) times daily.  Marland Kitchen lisinopril-hydrochlorothiazide (PRINZIDE,ZESTORETIC) 10-12.5 MG per tablet TAKE 1 TABLET EVERY DAY  . omeprazole (PRILOSEC) 40 MG capsule TAKE 1 CAPSULE (40 MG TOTAL) BY MOUTH 2 (TWO) TIMES DAILY.  . traMADol (ULTRAM) 50 MG tablet TAKE 1 TABLET TWICE A DAY  . [DISCONTINUED] solifenacin (VESICARE) 10 MG tablet Take 1 tablet (10 mg total) by mouth daily.   No facility-administered encounter medications on file as of  08/02/2015.    PAST MEDICAL HISTORY:   Past Medical History  Diagnosis Date  . Allergic rhinitis   . GERD (gastroesophageal reflux disease)   . Hypertension   . Depression   . Polymyalgia rheumatica     sees Dr. Estanislado Pandy   . Hyperlipidemia   . Diverticulosis   . Obesity   . Hemorrhoids   . Osteoarthritis     sees Dr. Estanislado Pandy   . Resting tremor     sees Dr. Floyde Parkins   . Restless legs     sees Dr. Floyde Parkins   . Paralysis agitans     sees Dr, Floyde Parkins     PAST SURGICAL HISTORY:   Past Surgical History  Procedure Laterality Date  . Cholecystectomy    . Tonsillectomy and adenoidectomy    . Knee surgery  03/2007    Left  knee for torn ligamints  . Replacement total knee Left 2007    per Dr. Ronnie Derby   . Vaginal hysterectomy    . Salpingoophorectomy    . Rotator cuff repair Right   . Esophagogastroduodenoscopy (egd) with esophageal dilation  10-12-10    per Dr. Fuller Plan  . Colonoscopy  03-27-09    per Dr. Fuller Plan, mild colitis and diverticulosis, repeat in 5 yrs     SOCIAL HISTORY:   Social History   Social History  . Marital Status: Single    Spouse Name: N/A  . Number of Children: N/A  . Years of Education: N/A   Occupational History  . PT Retail    Social History Main Topics  . Smoking status: Never Smoker   . Smokeless tobacco: Never Used  . Alcohol Use: No  . Drug Use: No  . Sexual Activity: Not on file   Other Topics Concern  . Not on file   Social History Narrative    FAMILY HISTORY:   Family Status  Relation Status Death Age  . Mother Deceased     alzheimer's, heart disease  . Father Deceased     HTN  . Sister Alive     healthy  . Brother Alive     HTN  . Son Alive     MI  . Son Alive     MI    ROS:  A complete 10 system review of systems was obtained and was unremarkable apart from what is mentioned above.  PHYSICAL EXAMINATION:    VITALS:   Filed Vitals:   08/02/15 0859  BP: 124/82  Pulse: 60  Height: 5\' 3"  (1.6 m)  Weight: 163 lb (73.936 kg)    GEN:  The patient appears stated age and is in NAD. HEENT:  Normocephalic, atraumatic.  The mucous membranes are moist. The superficial temporal arteries are without ropiness or tenderness. CV:  RRR Lungs:  CTAB Neck/HEME:  There are no carotid bruits bilaterally.  Neurological examination:  Orientation: The patient is alert and oriented x3. Fund of knowledge is appropriate.  Recent and remote memory are intact.  Attention and concentration are normal.    Able to name objects and repeat phrases. Cranial nerves: There is good facial symmetry. There is mild facial hypomimia.  Pupils are equal round and reactive to light  bilaterally. Fundoscopic exam reveals clear margins bilaterally. Extraocular muscles are intact. There are no square wave jerks.  The visual fields are full to confrontational testing. The speech is fluent and clear. Soft palate rises symmetrically and there is no tongue deviation. Hearing is intact to conversational  tone. Sensation: Sensation is intact to light and pinprick throughout (facial, trunk, extremities). Vibration is decreased at the bilateral big toe. There is no extinction with double simultaneous stimulation. There is no sensory dermatomal level identified. Motor: Strength is 5/5 in the bilateral upper and lower extremities.   Shoulder shrug is equal and symmetric.  There is no pronator drift. Deep tendon reflexes: Deep tendon reflexes are 2/4 at the bilateral biceps, triceps, brachioradialis, patella and achilles. Plantar responses are downgoing bilaterally.  Movement examination: Tone: There is slight increased tone in the bilateral upper extremities.  The tone in the lower extremities is normal.  Abnormal movements: There is an independent bilateral UE resting tremor, L more than R Coordination:  There is decremation with RAM's, seen most prominantly with alternation of supination/pronation of the forearm bilaterally, finger taps and heel taps bilaterally.   Gait and Station: The patient has no difficulty arising out of a deep-seated chair without the use of the hands. The patient's stride length is normal but slightly slow with normal arm swing; bilateral upper extremity resting tremor increases with ambulation.  The patient has a positive pull test.      ASSESSMENT/PLAN:  1.  Parkinsonism.  I suspect that this does represent idiopathic Parkinson's disease.  The patient has tremor, bradykinesia, rigidity and mild postural instability.  -We discussed the diagnosis as well as pathophysiology of the disease.  We discussed treatment options as well as prognostic indicators.  Patient  education was provided.  -Greater than 50% of the 60 minute visit was spent in counseling answering questions and talking about what to expect now as well as in the future.  We talked about medication options as well as potential future surgical options.  We talked about safety in the home.  -We decided to add carbidopa/levodopa 25/100.  1/2 tab tid x 1 wk, then 1/2 in am & noon & 1 at night for a week, then 1/2 in am &1 at noon &night for a week, then 1 po tid.  Risks, benefits, side effects and alternative therapies were discussed.  The opportunity to ask questions was given and they were answered to the best of my ability.  The patient expressed understanding and willingness to follow the outlined treatment protocols.  -I will refer the patient [facility], for [facility]/OT.  We talked about the importance of safe, cardiovascular exercise in Parkinson's disease.  -We discussed community resources in the area including patient support groups and community exercise programs for PD and pt education was provided to the patient. 2.  Follow up is anticipated in the next few months, sooner should new neurologic issues arise.

## 2015-08-02 NOTE — Patient Instructions (Signed)
1. Start Carbidopa Levodopa as follows: 1/2 tab three times a day before meals x 1 wk, then 1/2 in am & noon & 1 in evening for a week, then 1/2 in am &1 at noon &one in evening for a week, then 1 tablet three times a day before meals. 2. [facility] occupational [facility]. They will call you directly to schedule an appointment.  Please call [phone] if you do not hear from them.

## 2015-08-22 ENCOUNTER — Other Ambulatory Visit: Payer: Self-pay | Admitting: Family Medicine

## 2015-08-22 ENCOUNTER — Telehealth: Payer: Self-pay | Admitting: Family Medicine

## 2015-08-22 NOTE — Telephone Encounter (Signed)
Pt need refill on tramadol 50 mg twice a day send to [facility] [address]. Pt is out

## 2015-08-23 ENCOUNTER — Other Ambulatory Visit: Payer: Self-pay | Admitting: Family Medicine

## 2015-08-23 NOTE — Telephone Encounter (Signed)
Call in #[phone] with one rf  

## 2015-08-24 MED ORDER — TRAMADOL HCL 50 MG PO TABS: 50.0000 mg | ORAL_TABLET | Freq: Two times a day (BID) | ORAL | Status: DC

## 2015-08-24 NOTE — Telephone Encounter (Signed)
I called in script 

## 2015-09-19 ENCOUNTER — Other Ambulatory Visit: Payer: Self-pay | Admitting: Family Medicine

## 2015-10-31 DIAGNOSIS — R5381 Other malaise: Secondary | ICD-10-CM | POA: Diagnosis not present

## 2015-10-31 DIAGNOSIS — M7552 Bursitis of left shoulder: Secondary | ICD-10-CM | POA: Diagnosis not present

## 2015-10-31 DIAGNOSIS — M19041 Primary osteoarthritis, right hand: Secondary | ICD-10-CM | POA: Diagnosis not present

## 2015-10-31 DIAGNOSIS — M170 Bilateral primary osteoarthritis of knee: Secondary | ICD-10-CM | POA: Diagnosis not present

## 2015-11-07 ENCOUNTER — Ambulatory Visit: Payer: Medicare Other | Admitting: Neurology

## 2015-11-20 ENCOUNTER — Encounter: Payer: Self-pay | Admitting: Cardiology

## 2015-12-08 ENCOUNTER — Ambulatory Visit (INDEPENDENT_AMBULATORY_CARE_PROVIDER_SITE_OTHER): Payer: Medicare Other | Admitting: Family Medicine

## 2015-12-08 ENCOUNTER — Encounter: Payer: Self-pay | Admitting: Family Medicine

## 2015-12-08 VITALS — BP 140/78 | HR 60 | Temp 98.5°F | Ht 63.0 in | Wt 172.0 lb

## 2015-12-08 DIAGNOSIS — M353 Polymyalgia rheumatica: Secondary | ICD-10-CM | POA: Diagnosis not present

## 2015-12-08 MED ORDER — SOLIFENACIN SUCCINATE 5 MG PO TABS: 5.0000 mg | ORAL_TABLET | Freq: Every day | ORAL | Status: DC

## 2015-12-08 MED ORDER — TRAMADOL HCL 50 MG PO TABS: 100.0000 mg | ORAL_TABLET | Freq: Three times a day (TID) | ORAL | Status: DC | PRN

## 2015-12-08 MED ORDER — PREDNISONE 10 MG PO TABS: ORAL_TABLET | ORAL | Status: DC

## 2015-12-08 NOTE — Progress Notes (Signed)
   Subjective:    Patient ID: Leah Hodges, female    DOB: [birth year], 74 y.o.   MRN: [id number]  HPI Here to discuss diffuse muscle pains all over her body. These started rather suddenly about 2 months ago and they have persisted. These are worst in the shoulder areas and hip areas. Tramadol does not help much. She has a hx of PMR but this has not bothered her for a long time.    Review of Systems  Constitutional: Negative.   Respiratory: Negative.   Cardiovascular: Negative.   Musculoskeletal: Positive for myalgias and arthralgias.       Objective:   Physical Exam  Constitutional: She appears well-developed and well-nourished.  Cardiovascular: Normal rate, regular rhythm, normal heart sounds and intact distal pulses.   Pulmonary/Chest: Effort normal and breath sounds normal.  Musculoskeletal:  Very tender in the larger muscles of the arms and legs          Assessment & Plan:  This is a flare of her PMR. Given a Prednisone taper, starting at 40 mg a day. Increase the dose of Tramadol to use prn

## 2015-12-08 NOTE — Progress Notes (Signed)
Pre visit review using our clinic review tool, if applicable. No additional management support is needed unless otherwise documented below in the visit note. 

## 2016-01-02 ENCOUNTER — Encounter: Payer: Self-pay | Admitting: Neurology

## 2016-01-02 ENCOUNTER — Ambulatory Visit (INDEPENDENT_AMBULATORY_CARE_PROVIDER_SITE_OTHER): Payer: Medicare Other | Admitting: Neurology

## 2016-01-02 VITALS — BP 144/60 | HR 74 | Ht 63.0 in | Wt 167.0 lb

## 2016-01-02 DIAGNOSIS — G20 Parkinson's disease: Secondary | ICD-10-CM | POA: Diagnosis not present

## 2016-01-02 DIAGNOSIS — F329 Major depressive disorder, single episode, unspecified: Secondary | ICD-10-CM

## 2016-01-02 DIAGNOSIS — T7491XA Unspecified adult maltreatment, confirmed, initial encounter: Secondary | ICD-10-CM

## 2016-01-02 DIAGNOSIS — F32A Depression, unspecified: Secondary | ICD-10-CM

## 2016-01-02 MED ORDER — ESCITALOPRAM OXALATE 10 MG PO TABS: 10.0000 mg | ORAL_TABLET | Freq: Every day | ORAL | Status: DC

## 2016-01-02 MED ORDER — CARBIDOPA-LEVODOPA 25-100 MG PO TABS: 1.0000 | ORAL_TABLET | Freq: Three times a day (TID) | ORAL | Status: DC

## 2016-01-02 NOTE — Patient Instructions (Addendum)
1. Carbidopa Levodopa - make sure you pick up new prescription at your pharmacy and it says Carbidopa Levodopa 25/100 IR tablets. Take 1 tablet 3 times daily - at least 30 minutes prior to meals.  2. Start Lexapro 10 mg tablets once daily. Prescription has been sent to your pharmacy.  3. [facility] 24-hour Crisis Hotline [phone]. 4. Follow up in 4 months.
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
## 2016-01-09 ENCOUNTER — Ambulatory Visit (INDEPENDENT_AMBULATORY_CARE_PROVIDER_SITE_OTHER): Payer: Medicare Other | Admitting: Family Medicine

## 2016-01-09 ENCOUNTER — Encounter: Payer: Self-pay | Admitting: Family Medicine

## 2016-01-09 VITALS — BP 155/79 | HR 62 | Temp 98.6°F | Ht 63.0 in | Wt 170.0 lb

## 2016-01-09 DIAGNOSIS — R739 Hyperglycemia, unspecified: Secondary | ICD-10-CM | POA: Diagnosis not present

## 2016-01-09 DIAGNOSIS — I10 Essential (primary) hypertension: Secondary | ICD-10-CM | POA: Diagnosis not present

## 2016-01-09 DIAGNOSIS — Z0000 Encounter for general adult medical examination without abnormal findings: Secondary | ICD-10-CM | POA: Diagnosis not present

## 2016-01-09 DIAGNOSIS — R252 Cramp and spasm: Secondary | ICD-10-CM

## 2016-01-09 DIAGNOSIS — E559 Vitamin D deficiency, unspecified: Secondary | ICD-10-CM | POA: Diagnosis not present

## 2016-01-09 DIAGNOSIS — E538 Deficiency of other specified B group vitamins: Secondary | ICD-10-CM | POA: Diagnosis not present

## 2016-01-09 LAB — CBC WITH DIFFERENTIAL/PLATELET
BASOS PCT: 0.4 % (ref 0.0–3.0)
Basophils Absolute: 0 10*3/uL (ref 0.0–0.1)
EOS PCT: 0.8 % (ref 0.0–5.0)
Eosinophils Absolute: 0.1 10*3/uL (ref 0.0–0.7)
HCT: 40 % (ref 36.0–46.0)
HEMOGLOBIN: 13.1 g/dL (ref 12.0–15.0)
LYMPHS ABS: 2.1 10*3/uL (ref 0.7–4.0)
Lymphocytes Relative: 26.9 % (ref 12.0–46.0)
MCHC: 32.7 g/dL (ref 30.0–36.0)
MCV: 86 fl (ref 78.0–100.0)
MONOS PCT: 6.6 % (ref 3.0–12.0)
Monocytes Absolute: 0.5 10*3/uL (ref 0.1–1.0)
Neutro Abs: 5 10*3/uL (ref 1.4–7.7)
Neutrophils Relative %: 65.3 % (ref 43.0–77.0)
Platelets: 273 10*3/uL (ref 150.0–400.0)
RBC: 4.65 Mil/uL (ref 3.87–5.11)
RDW: 14.6 % (ref 11.5–15.5)
WBC: 7.6 10*3/uL (ref 4.0–10.5)

## 2016-01-09 LAB — HEPATIC FUNCTION PANEL
ALK PHOS: 100 U/L (ref 39–117)
ALT: 3 U/L (ref 0–35)
AST: 16 U/L (ref 0–37)
Albumin: 4.1 g/dL (ref 3.5–5.2)
BILIRUBIN DIRECT: 0.1 mg/dL (ref 0.0–0.3)
BILIRUBIN TOTAL: 0.5 mg/dL (ref 0.2–1.2)
TOTAL PROTEIN: 6.8 g/dL (ref 6.0–8.3)

## 2016-01-09 LAB — BASIC METABOLIC PANEL
BUN: 17 mg/dL (ref 6–23)
CHLORIDE: 102 meq/L (ref 96–112)
CO2: 33 meq/L — AB (ref 19–32)
CREATININE: 0.83 mg/dL (ref 0.40–1.20)
Calcium: 9.6 mg/dL (ref 8.4–10.5)
GFR: 71.06 mL/min (ref >60.00)
Glucose, Bld: 100 mg/dL — ABNORMAL HIGH (ref 70–99)
POTASSIUM: 4.6 meq/L (ref 3.5–5.1)
SODIUM: 142 meq/L (ref 135–145)

## 2016-01-09 LAB — POCT URINALYSIS DIPSTICK
BILIRUBIN UA: NEGATIVE
Blood, UA: NEGATIVE
GLUCOSE UA: NEGATIVE
Ketones, UA: NEGATIVE
Nitrite, UA: NEGATIVE
Protein, UA: NEGATIVE
SPEC GRAV UA: 1.015
UROBILINOGEN UA: 0.2
pH, UA: 8

## 2016-01-09 LAB — LIPID PANEL
CHOL/HDL RATIO: 3
Cholesterol: 236 mg/dL — ABNORMAL HIGH (ref 0–200)
HDL: 73.2 mg/dL (ref >39.00)
LDL Cholesterol: 138 mg/dL — ABNORMAL HIGH (ref 0–99)
NONHDL: 162.46
Triglycerides: 124 mg/dL (ref 0.0–149.0)
VLDL: 24.8 mg/dL (ref 0.0–40.0)

## 2016-01-09 LAB — VITAMIN B12: Vitamin B-12: 260 pg/mL (ref 211–911)

## 2016-01-09 LAB — MAGNESIUM: Magnesium: 2.3 mg/dL (ref 1.5–2.5)

## 2016-01-09 LAB — HEMOGLOBIN A1C: HEMOGLOBIN A1C: 5.7 % (ref 4.6–6.5)

## 2016-01-09 LAB — VITAMIN D 25 HYDROXY (VIT D DEFICIENCY, FRACTURES): VITD: 31.74 ng/mL (ref 30.00–100.00)

## 2016-01-09 LAB — TSH: TSH: 0.7 u[IU]/mL (ref 0.35–4.50)

## 2016-01-09 MED ORDER — LISINOPRIL-HYDROCHLOROTHIAZIDE 20-12.5 MG PO TABS: 1.0000 | ORAL_TABLET | Freq: Every day | ORAL | Status: DC
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
## 2016-01-09 NOTE — Progress Notes (Signed)
Pre visit review using our clinic review tool, if applicable. No additional management support is needed unless otherwise documented below in the visit note. 

## 2016-01-29 ENCOUNTER — Telehealth: Payer: Self-pay | Admitting: Family Medicine

## 2016-01-29 NOTE — Telephone Encounter (Signed)
[facility]  Patient Name: Leah Hodges  DOB: [birth year]    Initial Comment Caller states I am having allergies    Nurse Assessment  Nurse: Wayne Sever, RN, Tillie Rung Date/Time (Eastern Time): 01/29/2016 10:43:35 AM  Confirm and document reason for call. If symptomatic, describe symptoms. You must click the next button to save text entered. ---Caller states she goes through this once or twice a year and it's due to allergies and change in weather. Caller is stating it's allergies. She was wanting medication for the allergies  Has the patient traveled out of the country within the last 30 days? ---Not Applicable  Does the patient have any new or worsening symptoms? ---Yes  Will a triage be completed? ---Yes  Related visit to physician within the last 2 weeks? ---No  Does the PT have any chronic conditions? (i.e. diabetes, asthma, etc.) ---Yes  List chronic conditions. ---Arthritis, Parkinson's, HTN,  Is this a behavioral health or substance abuse call? ---No     Guidelines    Guideline Title Affirmed Question Affirmed Notes  Nasal Allergies (Hay Fever) Lots of coughing    Final Disposition User   See Physician within 24 Hours [employer], RN, Tillie Rung    Comments  Scheduled with Dr Sarajane Jews 02/21 at 14   Referrals  REFERRED TO PCP OFFICE   Disagree/Comply: Comply

## 2016-01-30 ENCOUNTER — Encounter: Payer: Self-pay | Admitting: Family Medicine

## 2016-01-30 ENCOUNTER — Ambulatory Visit (INDEPENDENT_AMBULATORY_CARE_PROVIDER_SITE_OTHER): Payer: Medicare Other | Admitting: Family Medicine

## 2016-01-30 VITALS — BP 109/60 | HR 64 | Temp 98.5°F | Ht 63.0 in | Wt 169.0 lb

## 2016-01-30 DIAGNOSIS — J209 Acute bronchitis, unspecified: Secondary | ICD-10-CM | POA: Diagnosis not present

## 2016-01-30 MED ORDER — HYDROCODONE-HOMATROPINE 5-1.5 MG/5ML PO SYRP: 5.0000 mL | ORAL_SOLUTION | ORAL | Status: DC | PRN

## 2016-01-30 MED ORDER — AZITHROMYCIN 250 MG PO TABS: ORAL_TABLET | ORAL | Status: DC

## 2016-01-30 NOTE — Progress Notes (Signed)
   Subjective:    Patient ID: Leah Hodges, female    DOB: 1940-12-21, 75 y.o.   MRN: [id number]  HPI Here for 3 days of chest burning and tightness and coughing up yellow sputum. No fever. Drinking fluids.    Review of Systems  Constitutional: Negative.   HENT: Negative.   Eyes: Negative.   Respiratory: Positive for cough and chest tightness. Negative for shortness of breath and wheezing.   Cardiovascular: Negative.        Objective:   Physical Exam  Constitutional: She appears well-developed and well-nourished. No distress.  HENT:  Right Ear: External ear normal.  Left Ear: External ear normal.  Nose: Nose normal.  Mouth/Throat: Oropharynx is clear and moist.  Eyes: Conjunctivae are normal.  Neck: No thyromegaly present.  Cardiovascular: Normal rate, regular rhythm, normal heart sounds and intact distal pulses.   Pulmonary/Chest: Effort normal. No respiratory distress. She has no wheezes. She has no rales.  Scattered rhonchi   Lymphadenopathy:    She has no cervical adenopathy.          Assessment & Plan:  Bronchitis, treat with a Zpack.

## 2016-01-30 NOTE — Progress Notes (Signed)
Pre visit review using our clinic review tool, if applicable. No additional management support is needed unless otherwise documented below in the visit note. 

## 2016-03-02 ENCOUNTER — Other Ambulatory Visit: Payer: Self-pay | Admitting: Family Medicine

## 2016-05-01 ENCOUNTER — Ambulatory Visit: Payer: Medicare Other | Admitting: Neurology

## 2016-05-13 DIAGNOSIS — G8929 Other chronic pain: Secondary | ICD-10-CM | POA: Diagnosis not present

## 2016-05-13 DIAGNOSIS — Z96652 Presence of left artificial knee joint: Secondary | ICD-10-CM | POA: Diagnosis not present

## 2016-05-13 DIAGNOSIS — M47812 Spondylosis without myelopathy or radiculopathy, cervical region: Secondary | ICD-10-CM | POA: Diagnosis not present

## 2016-05-13 DIAGNOSIS — M25512 Pain in left shoulder: Secondary | ICD-10-CM | POA: Diagnosis not present

## 2016-05-13 DIAGNOSIS — M542 Cervicalgia: Secondary | ICD-10-CM | POA: Diagnosis not present

## 2016-05-13 DIAGNOSIS — M50321 Other cervical disc degeneration at C4-C5 level: Secondary | ICD-10-CM | POA: Diagnosis not present

## 2016-05-13 DIAGNOSIS — M19012 Primary osteoarthritis, left shoulder: Secondary | ICD-10-CM | POA: Diagnosis not present

## 2016-05-13 DIAGNOSIS — G20 Parkinson's disease: Secondary | ICD-10-CM | POA: Diagnosis not present

## 2016-05-13 DIAGNOSIS — M353 Polymyalgia rheumatica: Secondary | ICD-10-CM | POA: Diagnosis not present

## 2016-05-13 DIAGNOSIS — E559 Vitamin D deficiency, unspecified: Secondary | ICD-10-CM | POA: Diagnosis not present

## 2016-06-17 DIAGNOSIS — M353 Polymyalgia rheumatica: Secondary | ICD-10-CM | POA: Diagnosis not present

## 2016-06-17 DIAGNOSIS — M150 Primary generalized (osteo)arthritis: Secondary | ICD-10-CM | POA: Diagnosis not present

## 2016-07-22 DIAGNOSIS — G20 Parkinson's disease: Secondary | ICD-10-CM | POA: Diagnosis not present

## 2016-07-22 DIAGNOSIS — R413 Other amnesia: Secondary | ICD-10-CM | POA: Diagnosis not present

## 2016-07-22 DIAGNOSIS — M4722 Other spondylosis with radiculopathy, cervical region: Secondary | ICD-10-CM | POA: Diagnosis not present

## 2016-07-22 DIAGNOSIS — M353 Polymyalgia rheumatica: Secondary | ICD-10-CM | POA: Diagnosis not present

## 2016-07-22 DIAGNOSIS — Z96652 Presence of left artificial knee joint: Secondary | ICD-10-CM | POA: Diagnosis not present

## 2016-08-13 DIAGNOSIS — M4722 Other spondylosis with radiculopathy, cervical region: Secondary | ICD-10-CM | POA: Diagnosis not present

## 2016-08-14 DIAGNOSIS — M4722 Other spondylosis with radiculopathy, cervical region: Secondary | ICD-10-CM | POA: Diagnosis not present

## 2016-08-16 DIAGNOSIS — M4722 Other spondylosis with radiculopathy, cervical region: Secondary | ICD-10-CM | POA: Diagnosis not present

## 2016-08-22 DIAGNOSIS — M4722 Other spondylosis with radiculopathy, cervical region: Secondary | ICD-10-CM | POA: Diagnosis not present

## 2016-08-26 DIAGNOSIS — M4722 Other spondylosis with radiculopathy, cervical region: Secondary | ICD-10-CM | POA: Diagnosis not present

## 2016-09-09 DIAGNOSIS — G20 Parkinson's disease: Secondary | ICD-10-CM | POA: Diagnosis not present

## 2016-09-09 DIAGNOSIS — I10 Essential (primary) hypertension: Secondary | ICD-10-CM | POA: Diagnosis not present

## 2016-09-09 DIAGNOSIS — K219 Gastro-esophageal reflux disease without esophagitis: Secondary | ICD-10-CM | POA: Diagnosis not present

## 2016-09-09 DIAGNOSIS — N3941 Urge incontinence: Secondary | ICD-10-CM | POA: Diagnosis not present

## 2016-09-09 DIAGNOSIS — M4722 Other spondylosis with radiculopathy, cervical region: Secondary | ICD-10-CM | POA: Diagnosis not present

## 2016-09-09 DIAGNOSIS — R4189 Other symptoms and signs involving cognitive functions and awareness: Secondary | ICD-10-CM | POA: Diagnosis not present

## 2016-09-10 DIAGNOSIS — M4722 Other spondylosis with radiculopathy, cervical region: Secondary | ICD-10-CM | POA: Diagnosis not present

## 2016-12-27 DIAGNOSIS — G3184 Mild cognitive impairment, so stated: Secondary | ICD-10-CM | POA: Diagnosis not present

## 2016-12-27 DIAGNOSIS — R0789 Other chest pain: Secondary | ICD-10-CM | POA: Diagnosis not present

## 2016-12-27 DIAGNOSIS — N644 Mastodynia: Secondary | ICD-10-CM | POA: Diagnosis not present

## 2016-12-27 DIAGNOSIS — J984 Other disorders of lung: Secondary | ICD-10-CM | POA: Diagnosis not present

## 2016-12-27 DIAGNOSIS — R918 Other nonspecific abnormal finding of lung field: Secondary | ICD-10-CM | POA: Diagnosis not present

## 2016-12-27 DIAGNOSIS — M1990 Unspecified osteoarthritis, unspecified site: Secondary | ICD-10-CM | POA: Diagnosis not present

## 2016-12-27 DIAGNOSIS — J90 Pleural effusion, not elsewhere classified: Secondary | ICD-10-CM | POA: Diagnosis not present

## 2016-12-27 DIAGNOSIS — K219 Gastro-esophageal reflux disease without esophagitis: Secondary | ICD-10-CM | POA: Diagnosis not present

## 2016-12-27 DIAGNOSIS — I10 Essential (primary) hypertension: Secondary | ICD-10-CM | POA: Diagnosis not present

## 2016-12-27 DIAGNOSIS — J9811 Atelectasis: Secondary | ICD-10-CM | POA: Diagnosis not present

## 2016-12-27 DIAGNOSIS — Z79899 Other long term (current) drug therapy: Secondary | ICD-10-CM | POA: Diagnosis not present

## 2016-12-27 DIAGNOSIS — M5489 Other dorsalgia: Secondary | ICD-10-CM | POA: Diagnosis not present

## 2016-12-27 DIAGNOSIS — G20 Parkinson's disease: Secondary | ICD-10-CM | POA: Diagnosis not present

## 2017-01-13 DIAGNOSIS — N3941 Urge incontinence: Secondary | ICD-10-CM | POA: Diagnosis not present

## 2017-01-13 DIAGNOSIS — G20 Parkinson's disease: Secondary | ICD-10-CM | POA: Diagnosis not present

## 2017-01-13 DIAGNOSIS — K219 Gastro-esophageal reflux disease without esophagitis: Secondary | ICD-10-CM | POA: Diagnosis not present

## 2017-01-13 DIAGNOSIS — I10 Essential (primary) hypertension: Secondary | ICD-10-CM | POA: Diagnosis not present

## 2017-01-13 DIAGNOSIS — R413 Other amnesia: Secondary | ICD-10-CM | POA: Diagnosis not present

## 2017-01-20 DIAGNOSIS — I10 Essential (primary) hypertension: Secondary | ICD-10-CM | POA: Diagnosis not present

## 2017-01-20 DIAGNOSIS — Z1231 Encounter for screening mammogram for malignant neoplasm of breast: Secondary | ICD-10-CM | POA: Diagnosis not present

## 2017-01-20 DIAGNOSIS — Z13820 Encounter for screening for osteoporosis: Secondary | ICD-10-CM | POA: Diagnosis not present

## 2017-01-20 DIAGNOSIS — Z0000 Encounter for general adult medical examination without abnormal findings: Secondary | ICD-10-CM | POA: Diagnosis not present

## 2017-01-20 DIAGNOSIS — K5900 Constipation, unspecified: Secondary | ICD-10-CM | POA: Diagnosis not present

## 2017-01-20 DIAGNOSIS — G20 Parkinson's disease: Secondary | ICD-10-CM | POA: Diagnosis not present

## 2017-01-20 DIAGNOSIS — Z780 Asymptomatic menopausal state: Secondary | ICD-10-CM | POA: Diagnosis not present

## 2017-02-01 IMAGING — CT CT OUTSIDE FILMS CHEST
2 of 3 series · 16 of 36 positions shown, 20 images · non-contrast
Comparison: none

[Series 3: — · axial · 0.84mm/px · z∈[-322,-46]mm · 13 of 108 slices shown, 17 images]
[im 8/108  mediastinal]
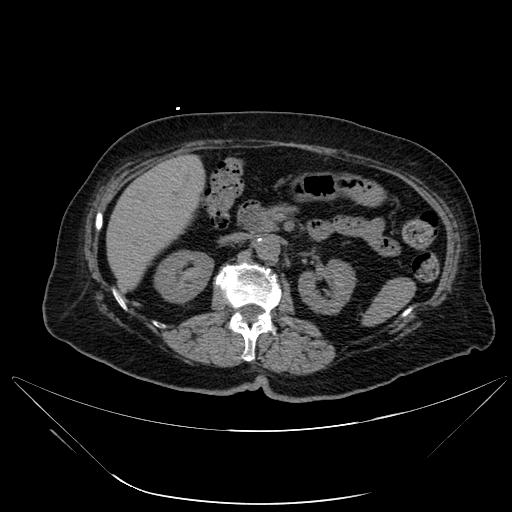
[im 8/108  lung]
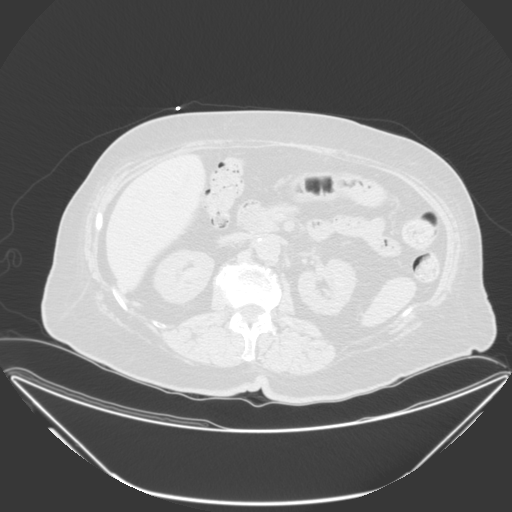
[im 16/108  lung]
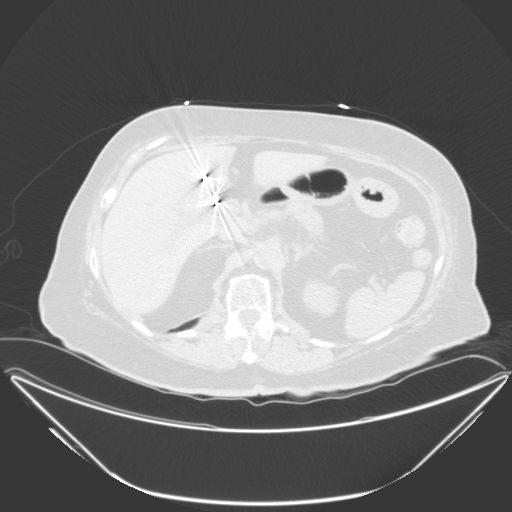
[im 23/108  lung]
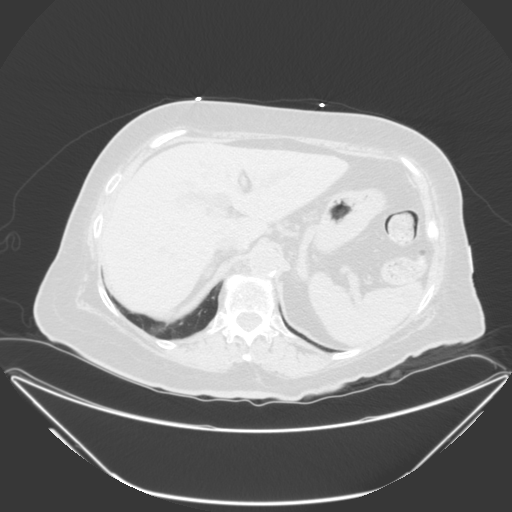
[im 31/108  lung]
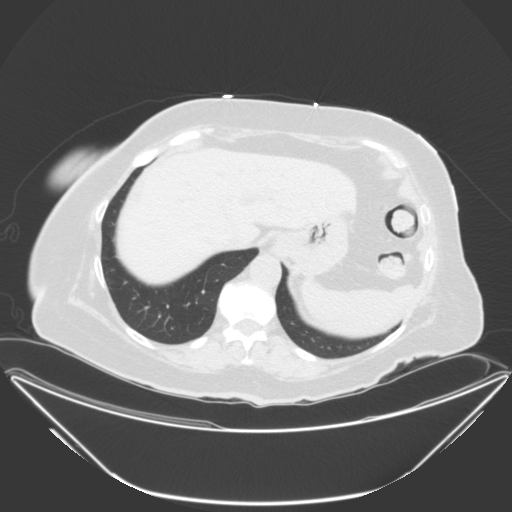
[im 39/108  mediastinal]
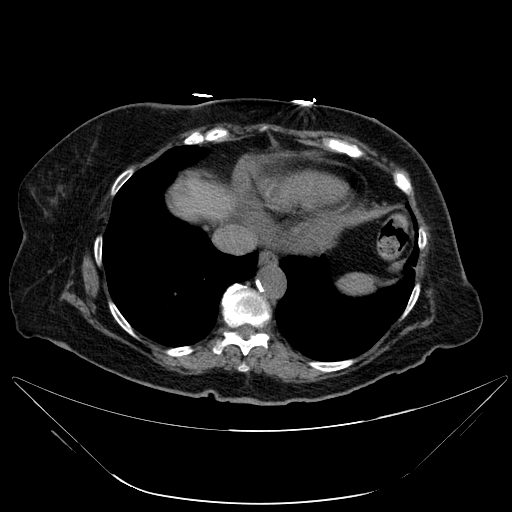
[im 39/108  lung]
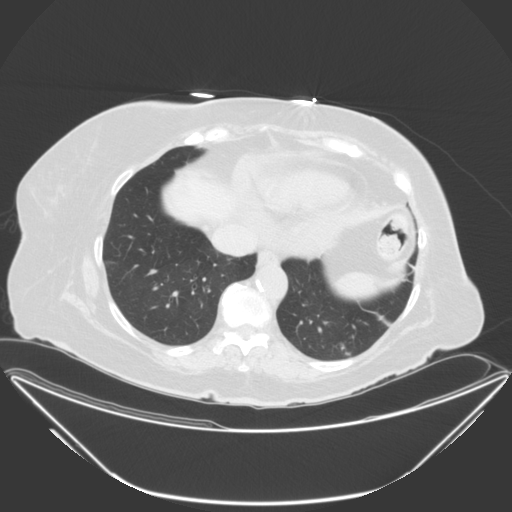
[im 46/108  lung]
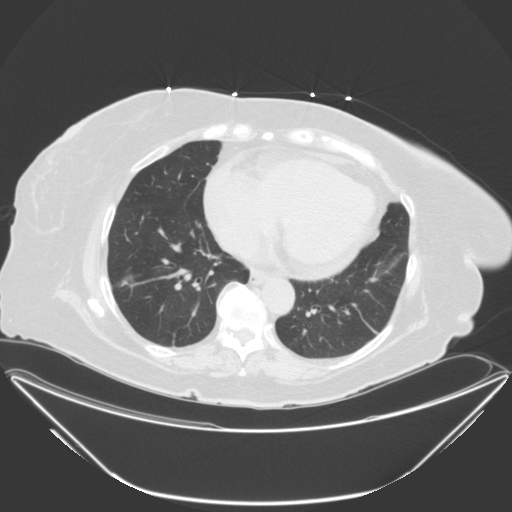
[im 54/108  lung]
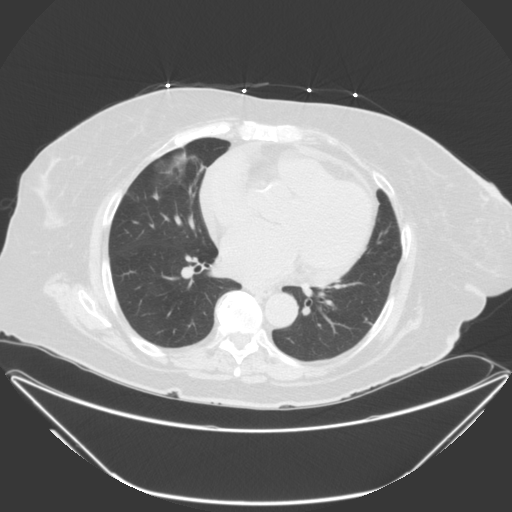
[im 62/108  lung]
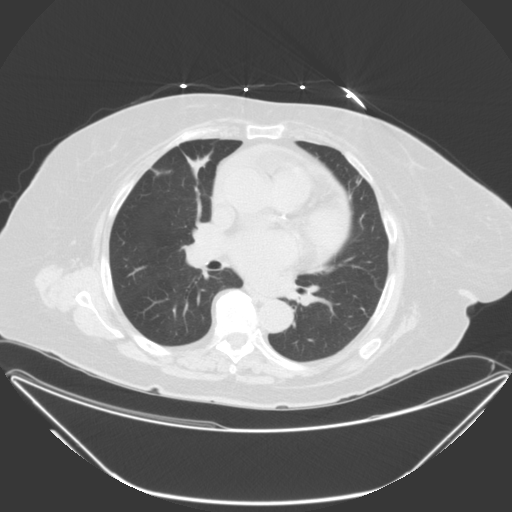
[im 69/108  mediastinal]
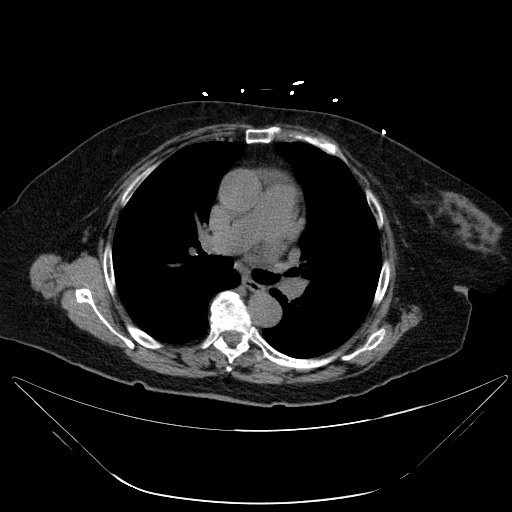
[im 69/108  lung]
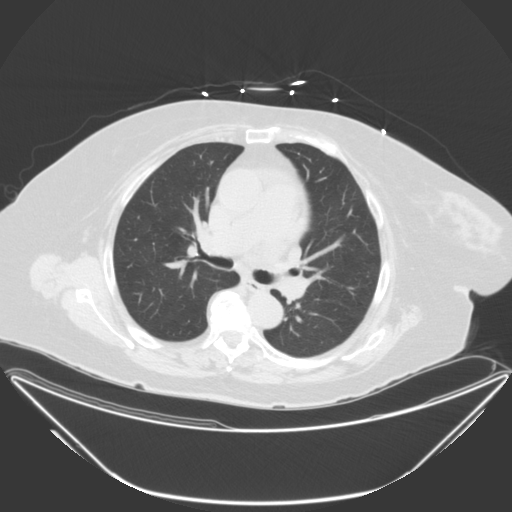
[im 77/108  lung]
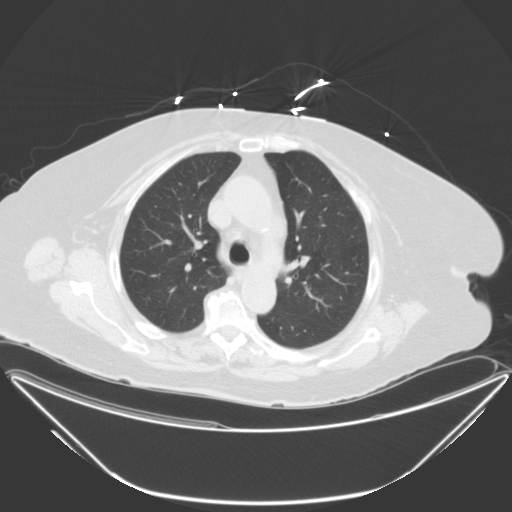
[im 85/108  lung]
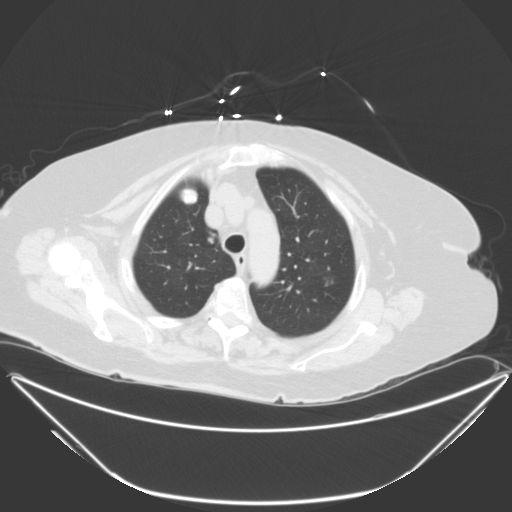
[im 92/108  lung]
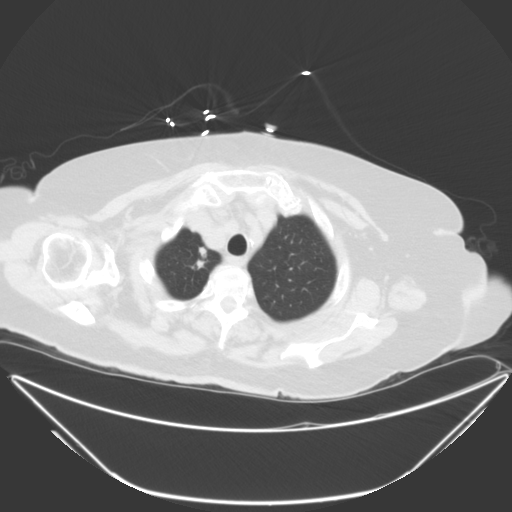
[im 100/108  mediastinal]
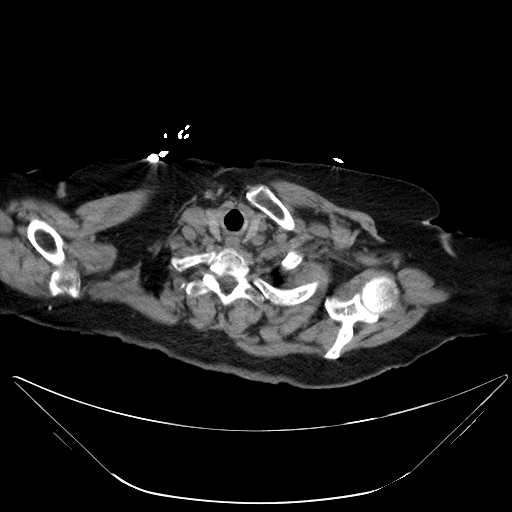
[im 100/108  lung]
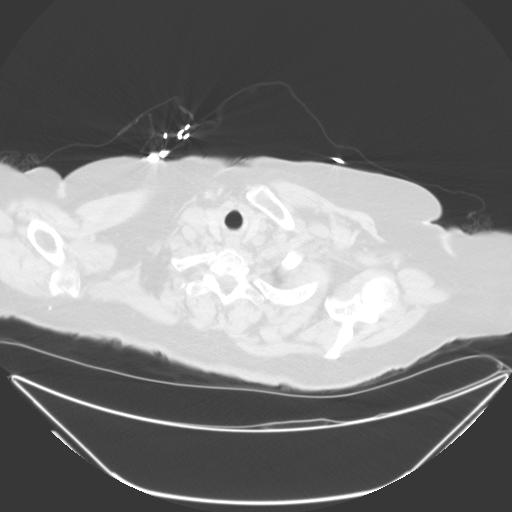

[coronals · coronal · 0.64mm/px · 3 of 73 slices shown]
[im 15/73  lung]
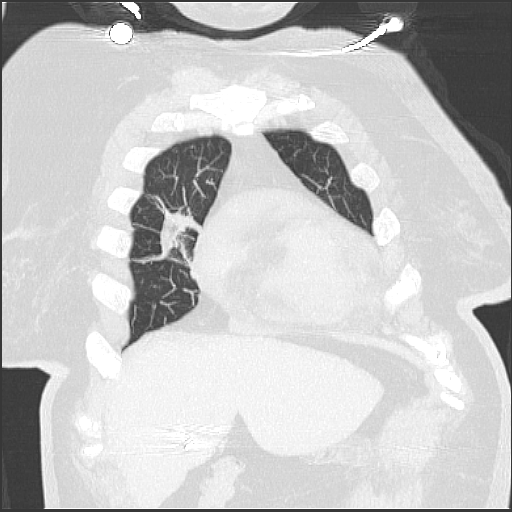
[im 29/73  lung]
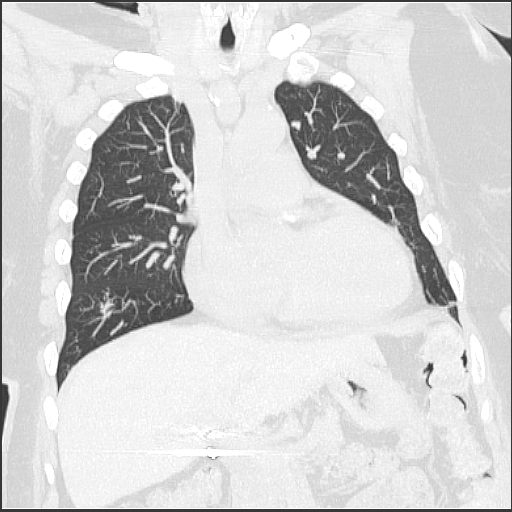
[im 44/73  lung]
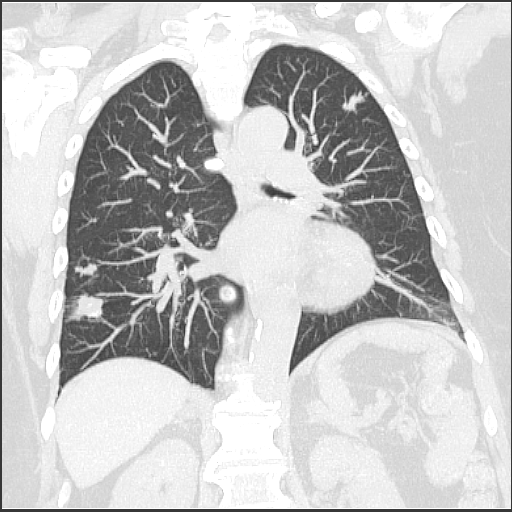

[16 of 36 positions shown; findings below may reference images not displayed]

Canned report from images found in remote index.

Refer to host system for actual result text.

## 2017-02-06 ENCOUNTER — Ambulatory Visit: Payer: Medicare Other | Admitting: Family Medicine

## 2017-02-20 ENCOUNTER — Telehealth: Payer: Self-pay | Admitting: Family Medicine

## 2017-02-20 NOTE — Telephone Encounter (Signed)
Error

## 2017-02-24 DIAGNOSIS — R918 Other nonspecific abnormal finding of lung field: Secondary | ICD-10-CM | POA: Diagnosis not present

## 2017-02-24 DIAGNOSIS — R410 Disorientation, unspecified: Secondary | ICD-10-CM | POA: Diagnosis not present

## 2017-03-10 DIAGNOSIS — Z13820 Encounter for screening for osteoporosis: Secondary | ICD-10-CM | POA: Diagnosis not present

## 2017-03-10 DIAGNOSIS — M1711 Unilateral primary osteoarthritis, right knee: Secondary | ICD-10-CM | POA: Diagnosis not present

## 2017-03-10 DIAGNOSIS — M8589 Other specified disorders of bone density and structure, multiple sites: Secondary | ICD-10-CM | POA: Diagnosis not present

## 2017-03-10 DIAGNOSIS — Z1231 Encounter for screening mammogram for malignant neoplasm of breast: Secondary | ICD-10-CM | POA: Diagnosis not present

## 2017-03-10 DIAGNOSIS — R413 Other amnesia: Secondary | ICD-10-CM | POA: Diagnosis not present

## 2017-03-10 DIAGNOSIS — M353 Polymyalgia rheumatica: Secondary | ICD-10-CM | POA: Diagnosis not present

## 2017-03-10 DIAGNOSIS — G20 Parkinson's disease: Secondary | ICD-10-CM | POA: Diagnosis not present

## 2017-03-10 DIAGNOSIS — Z96652 Presence of left artificial knee joint: Secondary | ICD-10-CM | POA: Diagnosis not present

## 2017-03-10 DIAGNOSIS — Z780 Asymptomatic menopausal state: Secondary | ICD-10-CM | POA: Diagnosis not present

## 2017-03-13 DIAGNOSIS — R262 Difficulty in walking, not elsewhere classified: Secondary | ICD-10-CM | POA: Diagnosis not present

## 2017-03-13 DIAGNOSIS — M1711 Unilateral primary osteoarthritis, right knee: Secondary | ICD-10-CM | POA: Diagnosis not present

## 2017-03-13 DIAGNOSIS — M6281 Muscle weakness (generalized): Secondary | ICD-10-CM | POA: Diagnosis not present

## 2017-03-13 DIAGNOSIS — G20 Parkinson's disease: Secondary | ICD-10-CM | POA: Diagnosis not present

## 2017-03-17 DIAGNOSIS — R911 Solitary pulmonary nodule: Secondary | ICD-10-CM | POA: Diagnosis not present

## 2017-03-17 DIAGNOSIS — R410 Disorientation, unspecified: Secondary | ICD-10-CM | POA: Diagnosis not present

## 2017-03-17 DIAGNOSIS — R918 Other nonspecific abnormal finding of lung field: Secondary | ICD-10-CM | POA: Diagnosis not present

## 2017-03-19 DIAGNOSIS — G20 Parkinson's disease: Secondary | ICD-10-CM | POA: Diagnosis not present

## 2017-03-19 DIAGNOSIS — M1711 Unilateral primary osteoarthritis, right knee: Secondary | ICD-10-CM | POA: Diagnosis not present

## 2017-03-19 DIAGNOSIS — M6281 Muscle weakness (generalized): Secondary | ICD-10-CM | POA: Diagnosis not present

## 2017-03-19 DIAGNOSIS — R262 Difficulty in walking, not elsewhere classified: Secondary | ICD-10-CM | POA: Diagnosis not present

## 2017-03-24 DIAGNOSIS — R918 Other nonspecific abnormal finding of lung field: Secondary | ICD-10-CM | POA: Diagnosis not present

## 2017-04-01 DIAGNOSIS — M1711 Unilateral primary osteoarthritis, right knee: Secondary | ICD-10-CM | POA: Diagnosis not present

## 2017-04-01 DIAGNOSIS — G20 Parkinson's disease: Secondary | ICD-10-CM | POA: Diagnosis not present

## 2017-04-01 DIAGNOSIS — M6281 Muscle weakness (generalized): Secondary | ICD-10-CM | POA: Diagnosis not present

## 2017-04-01 DIAGNOSIS — R262 Difficulty in walking, not elsewhere classified: Secondary | ICD-10-CM | POA: Diagnosis not present

## 2017-04-04 DIAGNOSIS — M1711 Unilateral primary osteoarthritis, right knee: Secondary | ICD-10-CM | POA: Diagnosis not present

## 2017-04-04 DIAGNOSIS — G20 Parkinson's disease: Secondary | ICD-10-CM | POA: Diagnosis not present

## 2017-04-04 DIAGNOSIS — R262 Difficulty in walking, not elsewhere classified: Secondary | ICD-10-CM | POA: Diagnosis not present

## 2017-04-04 DIAGNOSIS — M6281 Muscle weakness (generalized): Secondary | ICD-10-CM | POA: Diagnosis not present

## 2017-04-09 DIAGNOSIS — G20 Parkinson's disease: Secondary | ICD-10-CM | POA: Diagnosis not present

## 2017-04-09 DIAGNOSIS — R262 Difficulty in walking, not elsewhere classified: Secondary | ICD-10-CM | POA: Diagnosis not present

## 2017-04-09 DIAGNOSIS — M1711 Unilateral primary osteoarthritis, right knee: Secondary | ICD-10-CM | POA: Diagnosis not present

## 2017-04-09 DIAGNOSIS — M6281 Muscle weakness (generalized): Secondary | ICD-10-CM | POA: Diagnosis not present

## 2017-04-11 DIAGNOSIS — R262 Difficulty in walking, not elsewhere classified: Secondary | ICD-10-CM | POA: Diagnosis not present

## 2017-04-11 DIAGNOSIS — M1711 Unilateral primary osteoarthritis, right knee: Secondary | ICD-10-CM | POA: Diagnosis not present

## 2017-04-11 DIAGNOSIS — G20 Parkinson's disease: Secondary | ICD-10-CM | POA: Diagnosis not present

## 2017-04-11 DIAGNOSIS — M6281 Muscle weakness (generalized): Secondary | ICD-10-CM | POA: Diagnosis not present

## 2017-04-15 DIAGNOSIS — G20 Parkinson's disease: Secondary | ICD-10-CM | POA: Diagnosis not present

## 2017-04-15 DIAGNOSIS — M1711 Unilateral primary osteoarthritis, right knee: Secondary | ICD-10-CM | POA: Diagnosis not present

## 2017-04-15 DIAGNOSIS — R262 Difficulty in walking, not elsewhere classified: Secondary | ICD-10-CM | POA: Diagnosis not present

## 2017-04-15 DIAGNOSIS — M6281 Muscle weakness (generalized): Secondary | ICD-10-CM | POA: Diagnosis not present

## 2017-04-17 DIAGNOSIS — M6281 Muscle weakness (generalized): Secondary | ICD-10-CM | POA: Diagnosis not present

## 2017-04-17 DIAGNOSIS — R262 Difficulty in walking, not elsewhere classified: Secondary | ICD-10-CM | POA: Diagnosis not present

## 2017-04-17 DIAGNOSIS — G20 Parkinson's disease: Secondary | ICD-10-CM | POA: Diagnosis not present

## 2017-04-17 DIAGNOSIS — M1711 Unilateral primary osteoarthritis, right knee: Secondary | ICD-10-CM | POA: Diagnosis not present

## 2017-04-21 DIAGNOSIS — R413 Other amnesia: Secondary | ICD-10-CM | POA: Diagnosis not present

## 2017-04-21 DIAGNOSIS — G20 Parkinson's disease: Secondary | ICD-10-CM | POA: Diagnosis not present

## 2017-04-25 DIAGNOSIS — M1711 Unilateral primary osteoarthritis, right knee: Secondary | ICD-10-CM | POA: Diagnosis not present

## 2017-04-25 DIAGNOSIS — R262 Difficulty in walking, not elsewhere classified: Secondary | ICD-10-CM | POA: Diagnosis not present

## 2017-04-25 DIAGNOSIS — M6281 Muscle weakness (generalized): Secondary | ICD-10-CM | POA: Diagnosis not present

## 2017-04-25 DIAGNOSIS — G20 Parkinson's disease: Secondary | ICD-10-CM | POA: Diagnosis not present

## 2017-04-30 DIAGNOSIS — R262 Difficulty in walking, not elsewhere classified: Secondary | ICD-10-CM | POA: Diagnosis not present

## 2017-04-30 DIAGNOSIS — M1711 Unilateral primary osteoarthritis, right knee: Secondary | ICD-10-CM | POA: Diagnosis not present

## 2017-04-30 DIAGNOSIS — M6281 Muscle weakness (generalized): Secondary | ICD-10-CM | POA: Diagnosis not present

## 2017-04-30 DIAGNOSIS — G20 Parkinson's disease: Secondary | ICD-10-CM | POA: Diagnosis not present

## 2017-05-14 ENCOUNTER — Telehealth: Payer: Self-pay | Admitting: Family Medicine

## 2017-05-14 NOTE — Telephone Encounter (Signed)
Yes I can see her again

## 2017-05-14 NOTE — Telephone Encounter (Signed)
Pt was last seen 2017 and would like dr fry to see her again. Pt moved out of [place] and now back in [place]. Can I sch?

## 2017-05-15 NOTE — Telephone Encounter (Signed)
Pt has been scheduled.  °

## 2017-05-21 ENCOUNTER — Encounter: Payer: Self-pay | Admitting: Family Medicine

## 2017-05-21 ENCOUNTER — Ambulatory Visit (INDEPENDENT_AMBULATORY_CARE_PROVIDER_SITE_OTHER): Payer: Medicare Other | Admitting: Family Medicine

## 2017-05-21 VITALS — BP 140/82 | HR 85 | Temp 98.1°F | Ht 63.0 in | Wt 156.0 lb

## 2017-05-21 DIAGNOSIS — R918 Other nonspecific abnormal finding of lung field: Secondary | ICD-10-CM

## 2017-05-21 DIAGNOSIS — I10 Essential (primary) hypertension: Secondary | ICD-10-CM

## 2017-05-21 DIAGNOSIS — G20 Parkinson's disease: Secondary | ICD-10-CM | POA: Diagnosis not present

## 2017-05-21 DIAGNOSIS — F418 Other specified anxiety disorders: Secondary | ICD-10-CM | POA: Diagnosis not present

## 2017-05-21 MED ORDER — SERTRALINE HCL 50 MG PO TABS: 50.0000 mg | ORAL_TABLET | Freq: Every day | ORAL | 2 refills | Status: DC

## 2017-05-21 NOTE — Progress Notes (Signed)
   Subjective:    Patient ID: Leah Hodges, female    DOB: 04-Nov-1941, 76 y.o.   MRN: [id number]  HPI Here to re-establish with [place] after an absence of almost 2 years. She is here with Leah Hodges, her daughter, who is also a patient of mine. Leah Hodges had been living in [place], [place] but is in the process of moving back to [place]. They are looking for an independent apartment in a retirement community. She had been seeing Dr. Charlaine Dalton, a neurologist in [place], [place], for Parkinsons Disease. This has been well controlled on Sinemet. She has also been seeing Brain Hilts, a pulmonologist, for some lung nodules. These have been followed with CT scans and PET scan, and they are felt to not be neoplastic. The exact etiology is unclear but they appear to be inflammatory, so a possible etiology could be Mycobacterium avium. Her HTN has been stable. Both [place] and her daughter are concerned about some memory problems over the past year and about some mood issues. She has been very anxious off and on, and she often feels depressed. She can be sad and tearful, and her sleep and appetite have been affected.    Review of Systems  Constitutional: Positive for appetite change. Negative for chills, diaphoresis and fever.  Respiratory: Negative.   Cardiovascular: Negative.   Gastrointestinal: Negative.   Neurological: Positive for tremors. Negative for dizziness, seizures, syncope, facial asymmetry, speech difficulty, weakness, light-headedness, numbness and headaches.       Objective:   Physical Exam  Constitutional: She is oriented to person, place, and time. She appears well-developed and well-nourished.  Neck: No thyromegaly present.  Cardiovascular: Normal rate, regular rhythm, normal heart sounds and intact distal pulses.   Pulmonary/Chest: Effort normal and breath sounds normal. No respiratory distress. She has no wheezes. She has no rales.  Lymphadenopathy:    She has no cervical  adenopathy.  Neurological: She is alert and oriented to person, place, and time.  Psychiatric: She has a normal mood and affect. Her behavior is normal. Thought content normal.          Assessment & Plan:  She has Parkinsons disease and we will refer her to Neurology here to be re-established. She has pulmonary nodules and we will refer her to Pulmonary to be established. She has depression and anxiety and she will try Zoloft 50 mg daily. Recheck in 3-4 weeks. [name], MD

## 2017-05-21 NOTE — Patient Instructions (Signed)
WE NOW OFFER   Leah Hodges's FAST TRACK!!!  SAME DAY Appointments for ACUTE CARE  Such as: Sprains, Injuries, cuts, abrasions, rashes, muscle pain, joint pain, back pain Colds, flu, sore throats, headache, allergies, cough, fever  Ear pain, sinus and eye infections Abdominal pain, nausea, vomiting, diarrhea, upset stomach Animal/insect bites  3 Easy Ways to Schedule: Walk-In Scheduling Call in scheduling Mychart Sign-up: [email or web address]         
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
## 2017-05-27 ENCOUNTER — Ambulatory Visit (INDEPENDENT_AMBULATORY_CARE_PROVIDER_SITE_OTHER): Payer: Medicare Other | Admitting: Neurology

## 2017-05-27 ENCOUNTER — Encounter: Payer: Self-pay | Admitting: Neurology

## 2017-05-27 VITALS — BP 142/72 | HR 64 | Ht 63.0 in | Wt 157.0 lb

## 2017-05-27 DIAGNOSIS — G20 Parkinson's disease: Secondary | ICD-10-CM | POA: Diagnosis not present

## 2017-05-27 DIAGNOSIS — K5901 Slow transit constipation: Secondary | ICD-10-CM

## 2017-05-27 DIAGNOSIS — F330 Major depressive disorder, recurrent, mild: Secondary | ICD-10-CM

## 2017-05-27 DIAGNOSIS — G20A1 Parkinson's disease without dyskinesia, without mention of fluctuations: Secondary | ICD-10-CM

## 2017-05-27 NOTE — Patient Instructions (Signed)
Constipation and Parkinson's disease:  1.Rancho recipe for constipation in Parkinsons Disease:  -1 cup of unprocessed bran (need to get this at [employer], [employer] or similar type of store), 2 cups of applesauce in 1 cup of prune juice 2.  Increase fiber intake (Metamucil,vegetables) 3.  Regular, moderate exercise can be beneficial. 4.  Avoid medications causing constipation, such as medications like antacids with calcium or magnesium 5.  Laxative overuse should be avoided. 6.  Stool softeners (Colace) can help with chronic constipation. 7.  Increase water intake.  You should be drinking 1/2 gallon of water a day as long as you have not been diagnosed with congestive heart failure or renal/kidney failure.  This is probably the single greatest thing that you can do to help your constipation.  

## 2017-05-30 ENCOUNTER — Telehealth: Payer: Self-pay | Admitting: Neurology

## 2017-05-30 NOTE — Telephone Encounter (Signed)
PT's sister Jan called and said that she needs to speak to Dr Tat because she said that the daughter in law is making it out that she is crazy and she needs someone to talk to her because she said what is going on is not right and that Dr Tat was giving all the wrong information. OK to leave message if no answer [phone]

## 2017-05-30 NOTE — Telephone Encounter (Signed)
Patient had new DPR from 05/2017. She did not list sister and we can not call her.
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
## 2017-06-13 ENCOUNTER — Ambulatory Visit (INDEPENDENT_AMBULATORY_CARE_PROVIDER_SITE_OTHER): Payer: Medicare Other | Admitting: Family Medicine

## 2017-06-13 ENCOUNTER — Encounter: Payer: Self-pay | Admitting: Family Medicine

## 2017-06-13 VITALS — BP 138/84 | HR 62 | Temp 98.7°F | Ht 63.0 in | Wt 155.0 lb

## 2017-06-13 DIAGNOSIS — F418 Other specified anxiety disorders: Secondary | ICD-10-CM | POA: Diagnosis not present

## 2017-06-13 DIAGNOSIS — K219 Gastro-esophageal reflux disease without esophagitis: Secondary | ICD-10-CM

## 2017-06-13 DIAGNOSIS — I10 Essential (primary) hypertension: Secondary | ICD-10-CM

## 2017-06-13 MED ORDER — TRAMADOL HCL 50 MG PO TABS: 50.0000 mg | ORAL_TABLET | Freq: Three times a day (TID) | ORAL | 1 refills | Status: DC | PRN

## 2017-06-13 MED ORDER — OMEPRAZOLE 40 MG PO CPDR: DELAYED_RELEASE_CAPSULE | ORAL | 3 refills | Status: DC

## 2017-06-13 MED ORDER — AMLODIPINE BESYLATE 2.5 MG PO TABS: 2.5000 mg | ORAL_TABLET | Freq: Every day | ORAL | 3 refills | Status: DC

## 2017-06-13 NOTE — Patient Instructions (Signed)
WE NOW OFFER   Leah Hodges's FAST TRACK!!!  SAME DAY Appointments for ACUTE CARE  Such as: Sprains, Injuries, cuts, abrasions, rashes, muscle pain, joint pain, back pain Colds, flu, sore throats, headache, allergies, cough, fever  Ear pain, sinus and eye infections Abdominal pain, nausea, vomiting, diarrhea, upset stomach Animal/insect bites  3 Easy Ways to Schedule: Walk-In Scheduling Call in scheduling Mychart Sign-up: [email or web address]         

## 2017-06-13 NOTE — Progress Notes (Signed)
   Subjective:    Patient ID: Leah Hodges, female    DOB: 11/15/1941, 76 y.o.   MRN: [id number]  HPI Here to follow up. In general she is doing much better than at our last visit. At that time she was very stressed, especially about her living situation. She is now living with her "ex-daughter-in-law" and she is very happy with this. They get along and she has some independence. Her depression and anixety have improved a lot. She took Zoloft for one week but stopped it because it caused too much sedation. Now she does not think she needs a medication at all. She saw Dr. Carles Collet for the Parkinisons and this has been fairly well controlled. Her BP has been stable.    Review of Systems  Constitutional: Negative.   Respiratory: Negative.   Cardiovascular: Negative.   Neurological: Positive for tremors.  Psychiatric/Behavioral: Positive for dysphoric mood. The patient is nervous/anxious.        Objective:   Physical Exam  Constitutional: She is oriented to person, place, and time. She appears well-developed and well-nourished.  Cardiovascular: Normal rate, regular rhythm, normal heart sounds and intact distal pulses.   Pulmonary/Chest: Breath sounds normal. No respiratory distress. She has no wheezes. She has no rales.  Neurological: She is alert and oriented to person, place, and time.  Psychiatric: She has a normal mood and affect. Her behavior is normal. Thought content normal.          Assessment & Plan:  Her depression and anxiety are stable now and she will stay off medications. Her Parkinsons and HTN are stable. Her GERD is stable.  [name], MD

## 2017-06-23 ENCOUNTER — Telehealth: Payer: Self-pay | Admitting: Family Medicine

## 2017-06-23 NOTE — Telephone Encounter (Signed)
I spoke with pt, she is not having UTI symptoms, just frequent urination. She says that we gave her a medication in the past to help with this.

## 2017-06-23 NOTE — Telephone Encounter (Signed)
Pt would like to see if you could send her something in for her frequent urination pt refuse an appointment.  Pharm:  [facility] [address].

## 2017-06-24 MED ORDER — OXYBUTYNIN CHLORIDE ER 10 MG PO TB24: 10.0000 mg | ORAL_TABLET | Freq: Every day | ORAL | 5 refills | Status: DC

## 2017-06-24 NOTE — Telephone Encounter (Signed)
Call in Oxybutynin XL 10 mg to take once daily, #30 with 5 rf

## 2017-06-24 NOTE — Telephone Encounter (Signed)
I sent script [facility]scribe to [facility] and left a voice message with this information for pt.

## 2017-07-08 ENCOUNTER — Encounter: Payer: Self-pay | Admitting: Pulmonary Disease

## 2017-07-08 ENCOUNTER — Ambulatory Visit
Admission: RE | Admit: 2017-07-08 | Discharge: 2017-07-08 | Disposition: A | Discharge status: Home / Self Care | Payer: Self-pay | Source: Ambulatory Visit | Attending: Pulmonary Disease | Admitting: Pulmonary Disease

## 2017-07-08 ENCOUNTER — Ambulatory Visit (INDEPENDENT_AMBULATORY_CARE_PROVIDER_SITE_OTHER): Payer: Medicare Other | Admitting: Pulmonary Disease

## 2017-07-08 VITALS — BP 130/72 | HR 61 | Ht 63.0 in | Wt 155.4 lb

## 2017-07-08 DIAGNOSIS — R911 Solitary pulmonary nodule: Secondary | ICD-10-CM | POA: Diagnosis not present

## 2017-07-08 DIAGNOSIS — J309 Allergic rhinitis, unspecified: Secondary | ICD-10-CM

## 2017-07-08 DIAGNOSIS — R05 Cough: Secondary | ICD-10-CM

## 2017-07-08 DIAGNOSIS — R059 Cough, unspecified: Secondary | ICD-10-CM

## 2017-07-08 NOTE — Progress Notes (Signed)
   Subjective:    Patient ID: Leah Hodges, female    DOB: 1941/08/06, 76 y.o.   MRN: [id number]  HPI    Review of Systems  Constitutional: Negative.  Negative for fever and unexpected weight change.  HENT: Positive for congestion. Negative for dental problem, ear pain, nosebleeds, postnasal drip, rhinorrhea, sinus pressure, sneezing, sore throat and trouble swallowing.   Eyes: Positive for itching. Negative for redness.  Respiratory: Positive for cough, chest tightness and shortness of breath. Negative for wheezing.   Cardiovascular: Positive for leg swelling. Negative for palpitations.  Gastrointestinal: Negative.  Negative for nausea and vomiting.  Endocrine: Negative.   Genitourinary: Negative.  Negative for dysuria.  Musculoskeletal: Negative for joint swelling.  Skin: Negative.  Negative for rash.  Allergic/Immunologic: Negative.  Negative for environmental allergies, food allergies and immunocompromised state.  Neurological: Positive for dizziness. Negative for headaches.  Hematological: Bruises/bleeds easily.  Psychiatric/Behavioral: Negative.  Negative for dysphoric mood. The patient is not nervous/anxious.        Objective:   Physical Exam        Assessment & Plan:

## 2017-07-08 NOTE — Progress Notes (Signed)
Subjective:    Patient ID: Leah Hodges, female    DOB: 1941-01-10, 76 y.o.   MRN: [id number]  Synopsis: Referred in 2018 for pulmonary nodule    HPI Chief Complaint  Patient presents with  . pulmonary consult    Referred by Dr. Bailey Mech states she is here today to discuss her CAT scan she had done in [facility], she states the scan showed three nodules, Pt is c/o dry throat, continuous cough with clear mucus, chest congestion, denies wheezing   Leah Hodges is here to see me for evaluation of pulmonary nodules.  [place]  She says that she was noted to have pulmonary nodules which were evaluated initially in January 2018.  They were discovered because she had a CXR ordered for a complaint of coughing up mucus.  She has been coughing up mucus "forever" for which she takes Zantac and other allergy medicines which make it worse. She says that she feels like there is cotton in her throat.  Typically the mucus is clear and sticky. She has post nasal drip "all the time" for which she takes "anything she can find" for it.  She can't remember what allergy medicines she has tried.  She has been on immunotherapy a long time ago in [place].  The sinus congestion is definitely worse.  She has never been told she had a lung ptoblem prior to this discovery.  She denies dyspnea but she has a cough with chest congestion.  Lying flat makes it worse.  Eating a meal may occasionally make it worse.  She has rare heartburn or indigestion, but she sometimes wonders if the omeprazole is ineffective.    She is anxious about the nodules.    She has been more forgetful lately.    Past Medical History:  Diagnosis Date  . Allergic rhinitis   . Depression   . Diverticulosis   . GERD (gastroesophageal reflux disease)   . Hemorrhoids   . Hyperlipidemia   . Hypertension   . Obesity   . Osteoarthritis    sees Dr. Estanislado Pandy   . Paralysis agitans ([place])    sees Dr. Wells Guiles Tat   . Polymyalgia rheumatica (HCC)    sees Dr. Estanislado Pandy   . Resting tremor    sees Dr. Floyde Parkins   . Restless legs    sees Dr. Floyde Parkins      Family History  Problem Relation Age of Onset  . Alcohol abuse Unknown   . Alzheimer's disease Mother   . Hypertension Unknown   . Stroke Unknown   . Colon polyps Mother        in her 50's  . Colon cancer Neg Hx      Social History   Social History  . Marital status: Single    Spouse name: N/A  . Number of children: N/A  . Years of education: N/A   Occupational History  . PT [employer]    Social History Main Topics  . Smoking status: Never Smoker  . Smokeless tobacco: Never Used  . Alcohol use No  . Drug use: No  . Sexual activity: Not on file   Other Topics Concern  . Not on file   Social History Narrative  . No narrative on file     No Known Allergies   Outpatient Medications Prior to Visit  Medication Sig Dispense Refill  . amLODipine (NORVASC) 2.5 MG tablet Take 1 tablet (  2.5 mg total) by mouth daily. 90 tablet 3  . aspirin 81 MG tablet Take 81 mg by mouth daily.    Marland Kitchen CALCIUM-MAGNESIUM-ZINC PO Take by mouth daily.    . carbidopa-levodopa (SINEMET IR) 25-100 MG tablet Take 1 tablet by mouth 3 (three) times daily. (Patient taking differently: Take 1.5 tablets by mouth 3 (three) times daily. ) 90 tablet 3  . Cholecalciferol (VITAMIN D) 2000 UNITS tablet Take 2,000 Units by mouth daily.      . fish oil-omega-3 fatty acids 1000 MG capsule Take 2 g by mouth daily.      Marland Kitchen omeprazole (PRILOSEC) 40 MG capsule TAKE 1 CAPSULE (40 MG TOTAL) BY MOUTH 2 (TWO) TIMES DAILY. 180 capsule 3  . ondansetron (ZOFRAN) 4 MG tablet TAKE 1 TAB WITH EACH DOSE OF CARBIDOPA/LEVODOPA TO REDUCE NAUSEA.    Marland Kitchen oxybutynin (DITROPAN XL) 10 MG 24 hr tablet Take 1 tablet (10 mg total) by mouth at bedtime. 30 tablet 5  . traMADol (ULTRAM) 50 MG tablet Take 1 tablet (50 mg total) by mouth every 8 (eight) hours as needed for moderate  pain. 180 tablet 1   No facility-administered medications prior to visit.       Review of Systems  Constitutional: Negative for fever and unexpected weight change.  HENT: Negative for congestion, dental problem, ear pain, nosebleeds, postnasal drip, rhinorrhea, sinus pressure, sneezing, sore throat and trouble swallowing.   Eyes: Negative for redness and itching.  Respiratory: Negative for cough, chest tightness, shortness of breath and wheezing.   Cardiovascular: Negative for palpitations and leg swelling.  Gastrointestinal: Negative for nausea and vomiting.  Genitourinary: Negative for dysuria.  Musculoskeletal: Negative for joint swelling.  Skin: Negative for rash.  Allergic/Immunologic: Negative.  Negative for environmental allergies, food allergies and immunocompromised state.  Neurological: Negative for headaches.  Hematological: Does not bruise/bleed easily.  Psychiatric/Behavioral: Negative for dysphoric mood. The patient is not nervous/anxious.        Objective:   Physical Exam Vitals:   07/08/17 1457  BP: 130/72  Pulse: 61  SpO2: 97%  Weight: 155 lb 6.4 oz (70.5 kg)  Height: 5\' 3"  (1.6 m)   Gen: well appearing, no acute distress HENT: NCAT, OP clear, neck supple without masses Eyes: PERRL, EOMi Lymph: no cervical lymphadenopathy PULM: CTA B CV: RRR, no mgr, no JVD GI: BS+, soft, nontender, no hsm Derm: no rash or skin breakdown MSK: normal bulk and tone Neuro: A&Ox4, CN II-XII intact, strength 5/5 in all 4 extremities Psyche: normal mood and affect   Records from her 2018 pulmonary visits reviewed: It seems that she was referred in March 2018 for evaluation of a 1.4 x 1.0 cm right lower lobe nodule. She had a PET scan performed which did not show evidence of high uptake in the nodule. Pulmonary medicine suggested that she may have an atypical respiratory infection. There is talk of performing a bronchoscopy.  Chest imaging: January 2018 CT chest images  independently reviewed showing tree in bud nodularity in the right lower lobe, and then a separate nodule in the right lower lobe approximately 1.47 m in size 03/2017 PET CT OSH> 1.4x1.0 cm RLL nodule, SUV 5      Assessment & Plan:  Lung nodule - Plan: CT OUTSIDE FILMS CHEST, CT Chest Wo Contrast  Cough - Plan: Fungus Culture & Smear, Respiratory or Resp and Sputum Culture, AFB Culture & Smear  Allergic rhinitis, unspecified seasonality, unspecified trigger   Discussion: Tamre is here today to  establish care with [place] in [facility] for a pulmonary nodule. She is a lifelong nonsmoker and does not have a high risk occupational history. I have reviewed the images from the CT scan of her chest and PET scan which showed that the 1.4 x 1.0 right lower lobe nodule is smooth and round. Given the fact that it slightly increased in size it's reasonable to continue follow this but it's reassuring that the SUV max was normal on the PET/CT performed by the outside hospital in April 2018. So in summary, at this time there is no evidence that this is a malignant process. Because of her mucus production I agree with her pulmonologist in [place] that it's reasonable to sample the mucus for culture to ensure she does not have an atypical infection. However, I think it's most likely that the mucus production is just related to allergic rhinitis.  Plan: For your pulmonary nodules: We will get a CT chest in 09/2017 Please provide [place] with a sample of your mucus We will see you back in 09/2017  For your sinus congestion: Use Neil Med rinses with distilled water at least twice per day using the instructions on the package. 1/2 hour after using the [facility] Med rinse, use Nasacort two puffs in each nostril once per day.  Remember that the Nasacort can take 1-2 weeks to work after regular use. Use generic zyrtec (cetirizine) every day.  If this doesn't help, then stop taking it and use chlorpheniramine-phenylephrine  combination tablets.  Follow up in 09/2017   Current Outpatient Prescriptions:  .  amLODipine (NORVASC) 2.5 MG tablet, Take 1 tablet (2.5 mg total) by mouth daily., Disp: 90 tablet, Rfl: 3 .  aspirin 81 MG tablet, Take 81 mg by mouth daily., Disp: , Rfl:  .  CALCIUM-MAGNESIUM-ZINC PO, Take by mouth daily., Disp: , Rfl:  .  carbidopa-levodopa (SINEMET IR) 25-100 MG tablet, Take 1 tablet by mouth 3 (three) times daily. (Patient taking differently: Take 1.5 tablets by mouth 3 (three) times daily. ), Disp: 90 tablet, Rfl: 3 .  Cholecalciferol (VITAMIN D) 2000 UNITS tablet, Take 2,000 Units by mouth daily.  , Disp: , Rfl:  .  fish oil-omega-3 fatty acids 1000 MG capsule, Take 2 g by mouth daily.  , Disp: , Rfl:  .  omeprazole (PRILOSEC) 40 MG capsule, TAKE 1 CAPSULE (40 MG TOTAL) BY MOUTH 2 (TWO) TIMES DAILY., Disp: 180 capsule, Rfl: 3 .  ondansetron (ZOFRAN) 4 MG tablet, TAKE 1 TAB WITH EACH DOSE OF CARBIDOPA/LEVODOPA TO REDUCE NAUSEA., Disp: , Rfl:  .  oxybutynin (DITROPAN XL) 10 MG 24 hr tablet, Take 1 tablet (10 mg total) by mouth at bedtime., Disp: 30 tablet, Rfl: 5 .  traMADol (ULTRAM) 50 MG tablet, Take 1 tablet (50 mg total) by mouth every 8 (eight) hours as needed for moderate pain., Disp: 180 tablet, Rfl: 1

## 2017-07-08 NOTE — Patient Instructions (Addendum)
For your pulmonary nodules: We will get a CT chest in 09/2017 Please provide [place] with a sample of your mucus We will see you back in 09/2017  For your sinus congestion: Use Neil Med rinses with distilled water at least twice per day using the instructions on the package. 1/2 hour after using the [facility] Med rinse, use Nasacort two puffs in each nostril once per day.  Remember that the Nasacort can take 1-2 weeks to work after regular use. Use generic zyrtec (cetirizine) every day.  If this doesn't help, then stop taking it and use chlorpheniramine-phenylephrine combination tablets.  Follow up in 09/2017

## 2017-07-16 ENCOUNTER — Other Ambulatory Visit: Payer: Medicare Other

## 2017-07-16 DIAGNOSIS — R059 Cough, unspecified: Secondary | ICD-10-CM

## 2017-07-16 DIAGNOSIS — R05 Cough: Secondary | ICD-10-CM

## 2017-07-19 LAB — RESPIRATORY CULTURE OR RESPIRATORY AND SPUTUM CULTURE
GRAM STAIN: NONE SEEN
GRAM STAIN: NONE SEEN
Organism ID, Bacteria: NORMAL

## 2017-07-22 ENCOUNTER — Telehealth: Payer: Self-pay | Admitting: Pulmonary Disease

## 2017-07-22 ENCOUNTER — Other Ambulatory Visit: Payer: Medicare Other

## 2017-07-22 NOTE — Telephone Encounter (Signed)
Received a call from lab stating that pt was downstairs seeing if she needed to give more sputum for a culture and told lab that when I found out information, I would contact pt.  Called pt and left a message for her stating that she did not need to leave any more sputum; some of the results were still pending and when we did get all the results from the culture, we would contact her letting her know the results.  Nothing further needed at this time.

## 2017-07-22 NOTE — Telephone Encounter (Signed)
The results of the sputum are negative to date. The final results will take a a few weeks to come back.  Marshell Garfinkel MD [facility] 07/22/2017, 5:11 PM

## 2017-07-22 NOTE — Telephone Encounter (Signed)
Spoke with pt, aware of results/recs.  Nothing further needed.  

## 2017-07-22 NOTE — Telephone Encounter (Signed)
Spoke with patient. Advised her that the results are in her chart, but BQ is not in the office to advise on results. Asked if she would for the DOD to read her results, she stated yes.   PM, can you please look at her sputum culture results since BQ is out of the office? Thanks!

## 2017-08-07 ENCOUNTER — Other Ambulatory Visit: Payer: Self-pay | Admitting: Neurology

## 2017-08-09 DIAGNOSIS — I10 Essential (primary) hypertension: Secondary | ICD-10-CM | POA: Diagnosis not present

## 2017-08-09 DIAGNOSIS — G20 Parkinson's disease: Secondary | ICD-10-CM | POA: Diagnosis not present

## 2017-08-09 DIAGNOSIS — Z96652 Presence of left artificial knee joint: Secondary | ICD-10-CM | POA: Diagnosis not present

## 2017-08-09 DIAGNOSIS — J029 Acute pharyngitis, unspecified: Secondary | ICD-10-CM | POA: Diagnosis not present

## 2017-08-09 DIAGNOSIS — K219 Gastro-esophageal reflux disease without esophagitis: Secondary | ICD-10-CM | POA: Diagnosis not present

## 2017-08-15 LAB — FUNGUS CULTURE W SMEAR

## 2017-08-19 ENCOUNTER — Other Ambulatory Visit: Payer: Self-pay

## 2017-08-19 DIAGNOSIS — J3089 Other allergic rhinitis: Secondary | ICD-10-CM

## 2017-08-25 ENCOUNTER — Other Ambulatory Visit: Payer: Self-pay

## 2017-08-25 ENCOUNTER — Other Ambulatory Visit: Payer: Medicare Other

## 2017-08-25 DIAGNOSIS — J3089 Other allergic rhinitis: Secondary | ICD-10-CM | POA: Diagnosis not present

## 2017-08-25 DIAGNOSIS — R0600 Dyspnea, unspecified: Secondary | ICD-10-CM | POA: Diagnosis not present

## 2017-08-26 ENCOUNTER — Telehealth: Payer: Self-pay | Admitting: Pulmonary Disease

## 2017-08-26 NOTE — Telephone Encounter (Signed)
IgE (Immunoglobulin E), Serum <OR=114 kU/L 9   Resulting Agency  [facility]    Specimen Collected: 08/25/17 09:10 Last Resulted: 08/26/17 13:53                  Result Notes   Notes recorded by Len Blalock, CMA on 08/26/2017 at 4:42 PM EDT lmtcb X1 for pt to relay results/recs.   BQ can you explain this lab test in detail and what it means. I advised pt that it was ok and she kept asking me if this means the mold has gone away. I wanted to get the advice from you so I can tell pt. Thanks.

## 2017-08-27 LAB — TIQ- AMBIGUOUS ORDER

## 2017-08-27 LAB — ALLERGEN, C. HERBARUM, M2: Class: 0

## 2017-08-27 LAB — INTERPRETATION:

## 2017-08-27 NOTE — Telephone Encounter (Signed)
Spoke with patient. She is still asking about mold levels in her bloodwork. She seems to think BQ checked for mold allergies.   BQ, please advise.

## 2017-08-27 NOTE — Telephone Encounter (Signed)
Her serum IgE level was normal; this lab tends to be elevated in folks suffering from a severe allergy.  However there are nuances to interpreting this that are too complicated to explain on the phone.

## 2017-08-27 NOTE — Telephone Encounter (Signed)
BQ please explain these labs a little more in detail please.  Thanks

## 2017-08-28 LAB — IGE: IGE (IMMUNOGLOBULIN E), SERUM: 9 kU/L (ref <=114)

## 2017-08-28 NOTE — Telephone Encounter (Signed)
IgE is typically high with mold.  Hers was low which is good.

## 2017-08-29 LAB — AFB CULTURE WITH SMEAR (NOT AT ARMC)

## 2017-08-29 NOTE — Telephone Encounter (Signed)
Spoke with pt, advised message from BQ. Nothing further is needed.

## 2017-09-08 ENCOUNTER — Ambulatory Visit (INDEPENDENT_AMBULATORY_CARE_PROVIDER_SITE_OTHER)
Admission: RE | Admit: 2017-09-08 | Discharge: 2017-09-08 | Disposition: A | Discharge status: Home / Self Care | Payer: Medicare Other | Source: Ambulatory Visit | Attending: Pulmonary Disease | Admitting: Pulmonary Disease

## 2017-09-08 ENCOUNTER — Encounter (INDEPENDENT_AMBULATORY_CARE_PROVIDER_SITE_OTHER): Payer: Self-pay

## 2017-09-08 DIAGNOSIS — R911 Solitary pulmonary nodule: Secondary | ICD-10-CM | POA: Diagnosis not present

## 2017-09-08 DIAGNOSIS — R918 Other nonspecific abnormal finding of lung field: Secondary | ICD-10-CM | POA: Diagnosis not present

## 2017-09-15 ENCOUNTER — Encounter: Payer: Self-pay | Admitting: Family Medicine

## 2017-09-15 ENCOUNTER — Ambulatory Visit (INDEPENDENT_AMBULATORY_CARE_PROVIDER_SITE_OTHER): Payer: Medicare Other | Admitting: Family Medicine

## 2017-09-15 VITALS — BP 138/79 | HR 64 | Temp 98.6°F | Ht 63.0 in | Wt 150.0 lb

## 2017-09-15 DIAGNOSIS — K5900 Constipation, unspecified: Secondary | ICD-10-CM

## 2017-09-15 DIAGNOSIS — F411 Generalized anxiety disorder: Secondary | ICD-10-CM | POA: Diagnosis not present

## 2017-09-15 MED ORDER — SERTRALINE HCL 100 MG PO TABS: 100.0000 mg | ORAL_TABLET | Freq: Every day | ORAL | 3 refills | Status: DC

## 2017-09-15 NOTE — Patient Instructions (Signed)
WE NOW OFFER   [facility] FAST TRACK!!!  SAME DAY Appointments for ACUTE CARE  Such as: Sprains, Injuries, cuts, abrasions, rashes, muscle pain, joint pain, back pain Colds, flu, sore throats, headache, allergies, cough, fever  Ear pain, sinus and eye infections Abdominal pain, nausea, vomiting, diarrhea, upset stomach Animal/insect bites  3 Easy Ways to Schedule: Walk-In Scheduling Call in scheduling Mychart Sign-up: [email or web address]         

## 2017-09-15 NOTE — Progress Notes (Signed)
   Subjective:    Patient ID: Leah Hodges, female    DOB: [birth year], 76 y.o.   MRN: [id number]  HPI Here for several issues. First she has been under a lot of stress lately. She has been living with her ex-daughter in law and the two of them have been getting along well. However other family stress have been gathering and she feels anxious and about to lose control of her life. She taken a few Lorazepams and they help her sleep. Also she has been very constipated lately and feels bloated and full all the time. She has tried laxatives with mixed results.    Review of Systems  Constitutional: Negative.   Respiratory: Negative.   Cardiovascular: Negative.   Gastrointestinal: Negative.   Genitourinary: Negative.   Neurological: Negative.   Psychiatric/Behavioral: Positive for decreased concentration. Negative for agitation, dysphoric mood and hallucinations. The patient is nervous/anxious.        Objective:   Physical Exam  Constitutional: She is oriented to person, place, and time. She appears well-developed and well-nourished.  Cardiovascular: Normal rate, regular rhythm, normal heart sounds and intact distal pulses.   Pulmonary/Chest: Effort normal and breath sounds normal. No respiratory distress. She has no wheezes. She has no rales.  Neurological: She is alert and oriented to person, place, and time.          Assessment & Plan:  Constipation. She will try Miralax on a daily basis. Also for the anxiety she will get back on Zoloft 50 mg daily. Recheck prn. [name], MD

## 2017-09-19 ENCOUNTER — Other Ambulatory Visit: Payer: Self-pay | Admitting: Pulmonary Disease

## 2017-09-19 DIAGNOSIS — R911 Solitary pulmonary nodule: Secondary | ICD-10-CM

## 2017-09-26 ENCOUNTER — Telehealth: Payer: Self-pay | Admitting: Family Medicine
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
## 2017-09-26 NOTE — Telephone Encounter (Signed)
I agree. Have an OV on Monday if not better

## 2017-09-26 NOTE — Telephone Encounter (Signed)
LMTCB
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
## 2017-09-29 ENCOUNTER — Encounter: Payer: Self-pay | Admitting: Family Medicine

## 2017-09-29 ENCOUNTER — Ambulatory Visit (INDEPENDENT_AMBULATORY_CARE_PROVIDER_SITE_OTHER): Payer: Medicare Other | Admitting: Family Medicine

## 2017-09-29 VITALS — BP 150/80 | HR 70 | Temp 97.9°F | Wt 145.0 lb

## 2017-09-29 DIAGNOSIS — J40 Bronchitis, not specified as acute or chronic: Secondary | ICD-10-CM

## 2017-09-29 DIAGNOSIS — J209 Acute bronchitis, unspecified: Secondary | ICD-10-CM

## 2017-09-29 MED ORDER — AZITHROMYCIN 250 MG PO TABS: ORAL_TABLET | ORAL | 0 refills | Status: DC
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
## 2017-09-29 NOTE — Progress Notes (Signed)
   Subjective:    Patient ID: Leah Hodges, female    DOB: Oct 21, 1941, 76 y.o.   MRN: [id number]  HPI Here for 5 days of PND, chest tightness and coughing up yellow sputum. No fever.    Review of Systems  Constitutional: Negative.   HENT: Positive for congestion and postnasal drip. Negative for sinus pain, sinus pressure and sore throat.   Eyes: Negative.   Respiratory: Positive for cough and chest tightness.        Objective:   Physical Exam  Constitutional: She appears well-developed and well-nourished.  HENT:  Right Ear: External ear normal.  Left Ear: External ear normal.  Nose: Nose normal.  Mouth/Throat: Oropharynx is clear and moist.  Eyes: Conjunctivae are normal.  Neck: No thyromegaly present.  Pulmonary/Chest: Effort normal. No respiratory distress. She has no wheezes. She has no rales.  Scattered rhonchi   Lymphadenopathy:    She has no cervical adenopathy.          Assessment & Plan:  Bronchitis, treat with a Zpack.  [name], MD

## 2017-09-30 ENCOUNTER — Ambulatory Visit: Payer: Medicare Other | Admitting: Neurology

## 2017-10-01 NOTE — Telephone Encounter (Signed)
PT seen in ofc 09/29/17.

## 2017-10-08 ENCOUNTER — Ambulatory Visit (INDEPENDENT_AMBULATORY_CARE_PROVIDER_SITE_OTHER): Payer: Medicare Other | Admitting: Pulmonary Disease

## 2017-10-08 ENCOUNTER — Encounter: Payer: Self-pay | Admitting: Pulmonary Disease

## 2017-10-08 VITALS — BP 136/72 | HR 64 | Ht 63.0 in | Wt 143.0 lb

## 2017-10-08 DIAGNOSIS — R911 Solitary pulmonary nodule: Secondary | ICD-10-CM | POA: Diagnosis not present
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
## 2017-10-08 NOTE — Patient Instructions (Signed)
Pulmonary Nodules: There was no growth in the pulmonary nodules seen on the CT chest 09/2017 You need to have another CT chest in 09/2018  I recommend that you establish care with the pulmonary physicians in [place] again when you move back to [facility]

## 2017-10-21 MED ORDER — TRAMADOL HCL 50 MG PO TABS: 50.0000 mg | ORAL_TABLET | Freq: Four times a day (QID) | ORAL | 0 refills | Status: DC | PRN

## 2017-10-21 MED ORDER — AZITHROMYCIN 250 MG PO TABS: ORAL_TABLET | ORAL | 0 refills | Status: DC

## 2017-10-21 NOTE — Telephone Encounter (Signed)
Copied from [place]. Topic: Quick Communication - See Telephone Encounter >> Oct 21, 2017 12:41 PM Patrice Paradise wrote: Patient called and put her sister on the phone. Patient sister stated that the patient was picking up boxes and pulled a muscle in her lower back. Patient would like for Dr. Sarajane Jews to prescribe her something for the pain. Patient has moved to [place], [place] with her sister. Patient is requesting a call back from Dr. Sarajane Jews.   CRM for notification. See Telephone encounter for: 10/21/17.

## 2017-10-21 NOTE — Telephone Encounter (Signed)
Call in Tramadol 50 mg to take one every 6 hours prn pain, #120 with no rf. Also call in a Zpack. She needs to find a local PCP there

## 2017-10-21 NOTE — Telephone Encounter (Signed)
I spoke with pt, [facility] [place] [facility] pt request.

## 2017-10-22 ENCOUNTER — Telehealth: Payer: Self-pay | Admitting: *Deleted

## 2017-10-22 MED ORDER — CYCLOBENZAPRINE HCL 10 MG PO TABS: 10.0000 mg | ORAL_TABLET | Freq: Three times a day (TID) | ORAL | 0 refills | Status: DC | PRN

## 2017-10-22 NOTE — Telephone Encounter (Signed)
Rx was called on to pt's preferred pharmacy.

## 2017-10-22 NOTE — Telephone Encounter (Signed)
Call in Flexeril 10 mg to take TID prn muscle spasms, #90 with no rf

## 2017-10-22 NOTE — Telephone Encounter (Signed)
LMOM for pt to advised Rx was sent to pt's pharmacy.

## 2017-10-22 NOTE — Telephone Encounter (Signed)
Copied from [place] [id number]. Topic: Inquiry >> Oct 22, 2017 10:35 AM Scherrie Gerlach wrote: Pt states she wanted a muscle relaxer along with the pain med  [id number] [place], [address] [phone] (Phone) [phone] (Fax)

## 2017-11-03 ENCOUNTER — Other Ambulatory Visit: Payer: Self-pay | Admitting: Neurology

## 2017-11-03 DIAGNOSIS — M5416 Radiculopathy, lumbar region: Secondary | ICD-10-CM | POA: Diagnosis not present

## 2017-11-03 DIAGNOSIS — I10 Essential (primary) hypertension: Secondary | ICD-10-CM | POA: Diagnosis not present

## 2017-11-07 DIAGNOSIS — M47896 Other spondylosis, lumbar region: Secondary | ICD-10-CM | POA: Diagnosis not present

## 2017-11-07 DIAGNOSIS — M47816 Spondylosis without myelopathy or radiculopathy, lumbar region: Secondary | ICD-10-CM | POA: Diagnosis not present

## 2017-11-07 DIAGNOSIS — M4316 Spondylolisthesis, lumbar region: Secondary | ICD-10-CM | POA: Diagnosis not present

## 2017-11-07 DIAGNOSIS — M5416 Radiculopathy, lumbar region: Secondary | ICD-10-CM | POA: Diagnosis not present

## 2017-11-26 DIAGNOSIS — Z96652 Presence of left artificial knee joint: Secondary | ICD-10-CM | POA: Diagnosis not present

## 2017-11-26 DIAGNOSIS — S0083XA Contusion of other part of head, initial encounter: Secondary | ICD-10-CM | POA: Diagnosis not present

## 2017-11-26 DIAGNOSIS — S0990XA Unspecified injury of head, initial encounter: Secondary | ICD-10-CM | POA: Diagnosis not present

## 2017-11-26 DIAGNOSIS — S0993XA Unspecified injury of face, initial encounter: Secondary | ICD-10-CM | POA: Diagnosis not present

## 2017-11-26 DIAGNOSIS — S199XXA Unspecified injury of neck, initial encounter: Secondary | ICD-10-CM | POA: Diagnosis not present

## 2017-11-26 DIAGNOSIS — I10 Essential (primary) hypertension: Secondary | ICD-10-CM | POA: Diagnosis not present

## 2017-11-26 DIAGNOSIS — M50221 Other cervical disc displacement at C4-C5 level: Secondary | ICD-10-CM | POA: Diagnosis not present

## 2017-11-26 DIAGNOSIS — S0033XA Contusion of nose, initial encounter: Secondary | ICD-10-CM | POA: Diagnosis not present

## 2017-12-15 DIAGNOSIS — G20 Parkinson's disease: Secondary | ICD-10-CM | POA: Diagnosis not present

## 2017-12-15 DIAGNOSIS — Z96652 Presence of left artificial knee joint: Secondary | ICD-10-CM | POA: Diagnosis not present

## 2017-12-15 DIAGNOSIS — R918 Other nonspecific abnormal finding of lung field: Secondary | ICD-10-CM | POA: Diagnosis not present

## 2017-12-15 DIAGNOSIS — M353 Polymyalgia rheumatica: Secondary | ICD-10-CM | POA: Diagnosis not present

## 2017-12-15 DIAGNOSIS — N3281 Overactive bladder: Secondary | ICD-10-CM | POA: Diagnosis not present

## 2017-12-15 DIAGNOSIS — G5702 Lesion of sciatic nerve, left lower limb: Secondary | ICD-10-CM | POA: Diagnosis not present

## 2017-12-15 DIAGNOSIS — M4696 Unspecified inflammatory spondylopathy, lumbar region: Secondary | ICD-10-CM | POA: Diagnosis not present

## 2017-12-15 DIAGNOSIS — K219 Gastro-esophageal reflux disease without esophagitis: Secondary | ICD-10-CM | POA: Diagnosis not present

## 2017-12-17 DIAGNOSIS — Z96652 Presence of left artificial knee joint: Secondary | ICD-10-CM | POA: Diagnosis not present

## 2017-12-17 DIAGNOSIS — R2681 Unsteadiness on feet: Secondary | ICD-10-CM | POA: Diagnosis not present

## 2017-12-17 DIAGNOSIS — G20 Parkinson's disease: Secondary | ICD-10-CM | POA: Diagnosis not present

## 2017-12-17 DIAGNOSIS — G5702 Lesion of sciatic nerve, left lower limb: Secondary | ICD-10-CM | POA: Diagnosis not present

## 2017-12-17 DIAGNOSIS — M1990 Unspecified osteoarthritis, unspecified site: Secondary | ICD-10-CM | POA: Diagnosis not present

## 2017-12-17 DIAGNOSIS — M353 Polymyalgia rheumatica: Secondary | ICD-10-CM | POA: Diagnosis not present

## 2017-12-18 DIAGNOSIS — Z96652 Presence of left artificial knee joint: Secondary | ICD-10-CM | POA: Diagnosis not present

## 2017-12-18 DIAGNOSIS — G20 Parkinson's disease: Secondary | ICD-10-CM | POA: Diagnosis not present

## 2017-12-18 DIAGNOSIS — M353 Polymyalgia rheumatica: Secondary | ICD-10-CM | POA: Diagnosis not present

## 2017-12-18 DIAGNOSIS — G5702 Lesion of sciatic nerve, left lower limb: Secondary | ICD-10-CM | POA: Diagnosis not present

## 2017-12-18 DIAGNOSIS — M1990 Unspecified osteoarthritis, unspecified site: Secondary | ICD-10-CM | POA: Diagnosis not present

## 2017-12-18 DIAGNOSIS — R2681 Unsteadiness on feet: Secondary | ICD-10-CM | POA: Diagnosis not present

## 2017-12-23 DIAGNOSIS — R2681 Unsteadiness on feet: Secondary | ICD-10-CM | POA: Diagnosis not present

## 2017-12-23 DIAGNOSIS — G20 Parkinson's disease: Secondary | ICD-10-CM | POA: Diagnosis not present

## 2017-12-23 DIAGNOSIS — M1990 Unspecified osteoarthritis, unspecified site: Secondary | ICD-10-CM | POA: Diagnosis not present

## 2017-12-23 DIAGNOSIS — M353 Polymyalgia rheumatica: Secondary | ICD-10-CM | POA: Diagnosis not present

## 2017-12-23 DIAGNOSIS — G5702 Lesion of sciatic nerve, left lower limb: Secondary | ICD-10-CM | POA: Diagnosis not present

## 2017-12-23 DIAGNOSIS — Z96652 Presence of left artificial knee joint: Secondary | ICD-10-CM | POA: Diagnosis not present

## 2017-12-25 DIAGNOSIS — M353 Polymyalgia rheumatica: Secondary | ICD-10-CM | POA: Diagnosis not present

## 2017-12-25 DIAGNOSIS — Z96652 Presence of left artificial knee joint: Secondary | ICD-10-CM | POA: Diagnosis not present

## 2017-12-25 DIAGNOSIS — R2681 Unsteadiness on feet: Secondary | ICD-10-CM | POA: Diagnosis not present

## 2017-12-25 DIAGNOSIS — M1990 Unspecified osteoarthritis, unspecified site: Secondary | ICD-10-CM | POA: Diagnosis not present

## 2017-12-25 DIAGNOSIS — G5702 Lesion of sciatic nerve, left lower limb: Secondary | ICD-10-CM | POA: Diagnosis not present

## 2017-12-25 DIAGNOSIS — G20 Parkinson's disease: Secondary | ICD-10-CM | POA: Diagnosis not present

## 2017-12-31 DIAGNOSIS — Z96652 Presence of left artificial knee joint: Secondary | ICD-10-CM | POA: Diagnosis not present

## 2017-12-31 DIAGNOSIS — M353 Polymyalgia rheumatica: Secondary | ICD-10-CM | POA: Diagnosis not present

## 2017-12-31 DIAGNOSIS — G20 Parkinson's disease: Secondary | ICD-10-CM | POA: Diagnosis not present

## 2017-12-31 DIAGNOSIS — R2681 Unsteadiness on feet: Secondary | ICD-10-CM | POA: Diagnosis not present

## 2017-12-31 DIAGNOSIS — M1990 Unspecified osteoarthritis, unspecified site: Secondary | ICD-10-CM | POA: Diagnosis not present

## 2017-12-31 DIAGNOSIS — G5702 Lesion of sciatic nerve, left lower limb: Secondary | ICD-10-CM | POA: Diagnosis not present

## 2018-01-02 DIAGNOSIS — M1990 Unspecified osteoarthritis, unspecified site: Secondary | ICD-10-CM | POA: Diagnosis not present

## 2018-01-02 DIAGNOSIS — Z96652 Presence of left artificial knee joint: Secondary | ICD-10-CM | POA: Diagnosis not present

## 2018-01-02 DIAGNOSIS — R2681 Unsteadiness on feet: Secondary | ICD-10-CM | POA: Diagnosis not present

## 2018-01-02 DIAGNOSIS — G20 Parkinson's disease: Secondary | ICD-10-CM | POA: Diagnosis not present

## 2018-01-02 DIAGNOSIS — G5702 Lesion of sciatic nerve, left lower limb: Secondary | ICD-10-CM | POA: Diagnosis not present

## 2018-01-02 DIAGNOSIS — M353 Polymyalgia rheumatica: Secondary | ICD-10-CM | POA: Diagnosis not present

## 2018-01-05 DIAGNOSIS — R413 Other amnesia: Secondary | ICD-10-CM | POA: Diagnosis not present

## 2018-01-06 DIAGNOSIS — Z96652 Presence of left artificial knee joint: Secondary | ICD-10-CM | POA: Diagnosis not present

## 2018-01-06 DIAGNOSIS — M353 Polymyalgia rheumatica: Secondary | ICD-10-CM | POA: Diagnosis not present

## 2018-01-06 DIAGNOSIS — M1990 Unspecified osteoarthritis, unspecified site: Secondary | ICD-10-CM | POA: Diagnosis not present

## 2018-01-06 DIAGNOSIS — G20 Parkinson's disease: Secondary | ICD-10-CM | POA: Diagnosis not present

## 2018-01-06 DIAGNOSIS — R2681 Unsteadiness on feet: Secondary | ICD-10-CM | POA: Diagnosis not present

## 2018-01-06 DIAGNOSIS — G5702 Lesion of sciatic nerve, left lower limb: Secondary | ICD-10-CM | POA: Diagnosis not present

## 2018-01-08 DIAGNOSIS — G20 Parkinson's disease: Secondary | ICD-10-CM | POA: Diagnosis not present

## 2018-01-08 DIAGNOSIS — Z96652 Presence of left artificial knee joint: Secondary | ICD-10-CM | POA: Diagnosis not present

## 2018-01-08 DIAGNOSIS — M353 Polymyalgia rheumatica: Secondary | ICD-10-CM | POA: Diagnosis not present

## 2018-01-08 DIAGNOSIS — R2681 Unsteadiness on feet: Secondary | ICD-10-CM | POA: Diagnosis not present

## 2018-01-08 DIAGNOSIS — B351 Tinea unguium: Secondary | ICD-10-CM | POA: Diagnosis not present

## 2018-01-08 DIAGNOSIS — M79674 Pain in right toe(s): Secondary | ICD-10-CM | POA: Diagnosis not present

## 2018-01-08 DIAGNOSIS — M1990 Unspecified osteoarthritis, unspecified site: Secondary | ICD-10-CM | POA: Diagnosis not present

## 2018-01-08 DIAGNOSIS — M2041 Other hammer toe(s) (acquired), right foot: Secondary | ICD-10-CM | POA: Diagnosis not present

## 2018-01-08 DIAGNOSIS — G5702 Lesion of sciatic nerve, left lower limb: Secondary | ICD-10-CM | POA: Diagnosis not present

## 2018-01-12 ENCOUNTER — Ambulatory Visit: Payer: Medicare Other | Admitting: Family Medicine

## 2018-01-13 DIAGNOSIS — M353 Polymyalgia rheumatica: Secondary | ICD-10-CM | POA: Diagnosis not present

## 2018-01-13 DIAGNOSIS — Z96652 Presence of left artificial knee joint: Secondary | ICD-10-CM | POA: Diagnosis not present

## 2018-01-13 DIAGNOSIS — R2681 Unsteadiness on feet: Secondary | ICD-10-CM | POA: Diagnosis not present

## 2018-01-13 DIAGNOSIS — M1990 Unspecified osteoarthritis, unspecified site: Secondary | ICD-10-CM | POA: Diagnosis not present

## 2018-01-13 DIAGNOSIS — G20 Parkinson's disease: Secondary | ICD-10-CM | POA: Diagnosis not present

## 2018-01-13 DIAGNOSIS — G5702 Lesion of sciatic nerve, left lower limb: Secondary | ICD-10-CM | POA: Diagnosis not present

## 2018-03-21 ENCOUNTER — Other Ambulatory Visit: Payer: Self-pay | Admitting: Family Medicine

## 2018-04-13 DIAGNOSIS — G20 Parkinson's disease: Secondary | ICD-10-CM | POA: Diagnosis not present

## 2018-04-13 DIAGNOSIS — R413 Other amnesia: Secondary | ICD-10-CM | POA: Diagnosis not present

## 2018-05-18 DIAGNOSIS — R918 Other nonspecific abnormal finding of lung field: Secondary | ICD-10-CM | POA: Diagnosis not present

## 2018-05-18 DIAGNOSIS — F0280 Dementia in other diseases classified elsewhere without behavioral disturbance: Secondary | ICD-10-CM | POA: Diagnosis not present

## 2018-05-18 DIAGNOSIS — R410 Disorientation, unspecified: Secondary | ICD-10-CM | POA: Diagnosis not present

## 2018-05-18 DIAGNOSIS — T50991A Poisoning by other drugs, medicaments and biological substances, accidental (unintentional), initial encounter: Secondary | ICD-10-CM | POA: Diagnosis not present

## 2018-05-18 DIAGNOSIS — R2689 Other abnormalities of gait and mobility: Secondary | ICD-10-CM | POA: Diagnosis not present

## 2018-05-18 DIAGNOSIS — I10 Essential (primary) hypertension: Secondary | ICD-10-CM | POA: Diagnosis not present

## 2018-05-18 DIAGNOSIS — Z79899 Other long term (current) drug therapy: Secondary | ICD-10-CM | POA: Diagnosis not present

## 2018-05-18 DIAGNOSIS — G20 Parkinson's disease: Secondary | ICD-10-CM | POA: Diagnosis not present

## 2018-05-18 DIAGNOSIS — R4182 Altered mental status, unspecified: Secondary | ICD-10-CM | POA: Diagnosis not present

## 2018-05-18 DIAGNOSIS — N3281 Overactive bladder: Secondary | ICD-10-CM | POA: Diagnosis not present

## 2018-05-18 DIAGNOSIS — W19XXXA Unspecified fall, initial encounter: Secondary | ICD-10-CM | POA: Diagnosis not present

## 2018-05-25 ENCOUNTER — Telehealth: Payer: Self-pay | Admitting: Family Medicine

## 2018-05-25 NOTE — Telephone Encounter (Signed)
Sent to PCP to advise 

## 2018-05-25 NOTE — Telephone Encounter (Signed)
Copied from [place] [id number]. Topic: Quick Communication - See Telephone Encounter >> May 25, 2018 11:43 AM Bea Graff, NT wrote: CRM for notification. See Telephone encounter for: 05/25/18. Pts daughter-in-law Jerrye Beavers calling regarding pts medication traMADol (ULTRAM) 50 MG tablet. She states pt went to the hospital in [place] last week and they stopped her medication and daughter-in-law states that she thought you have to weaned off and wants to know what they should do. CB#: [phone]

## 2018-05-25 NOTE — Telephone Encounter (Signed)
Patient's daughter-in-law Leah Hodges called and says "last week Leah Hodges was discharged from the hospital in [place], [place] after a 4 day stay for falling and altered mental status. She was down visiting her sister and she fell, but said she was ok. Her sister called 22 and she was transported to the hospital. The sister was in the emergency room with her and got kicked out, so she left and never told us [name] was in the hospital. So, I guess they admitted her because there was no one else around. Anyway, we got word, called and talked to the Case Manager and went to get her. She said she is going to continue taking the Tramadol until she hears from Dr. Sarajane Jews not to take it. I was wondering if this is a drug that will need to be weaned off. She is having moments where she is clear and then moments she's confused. She has an appointment on 06/02/18, so if he wants to wait until then, if someone can just let me know. My call back number is [phone]." I advised I will send this to Dr. Sarajane Jews.

## 2018-05-26 NOTE — Telephone Encounter (Signed)
Yes if we want to stop the Tramadol it we should wean it down slowly. Have her make an OV with [place] soon

## 2018-05-26 NOTE — Telephone Encounter (Signed)
Mailbox is full.

## 2018-05-27 NOTE — Telephone Encounter (Signed)
Called and spoke with Leah Hodges. Leah Hodges advised and voiced understanding. Pt is scheduled for an upcoming appt on 25/2019.

## 2018-06-02 ENCOUNTER — Ambulatory Visit (INDEPENDENT_AMBULATORY_CARE_PROVIDER_SITE_OTHER): Payer: Medicare Other | Admitting: Family Medicine

## 2018-06-02 ENCOUNTER — Encounter: Payer: Self-pay | Admitting: Family Medicine

## 2018-06-02 VITALS — BP 138/52 | HR 63 | Temp 98.1°F | Ht 62.0 in | Wt 133.6 lb

## 2018-06-02 DIAGNOSIS — G4761 Periodic limb movement disorder: Secondary | ICD-10-CM | POA: Diagnosis not present

## 2018-06-02 DIAGNOSIS — G20 Parkinson's disease: Secondary | ICD-10-CM | POA: Diagnosis not present

## 2018-06-02 DIAGNOSIS — Z0000 Encounter for general adult medical examination without abnormal findings: Secondary | ICD-10-CM

## 2018-06-02 DIAGNOSIS — R7401 Elevation of levels of liver transaminase levels: Secondary | ICD-10-CM

## 2018-06-02 DIAGNOSIS — N3281 Overactive bladder: Secondary | ICD-10-CM | POA: Diagnosis not present

## 2018-06-02 DIAGNOSIS — F418 Other specified anxiety disorders: Secondary | ICD-10-CM

## 2018-06-02 DIAGNOSIS — K219 Gastro-esophageal reflux disease without esophagitis: Secondary | ICD-10-CM

## 2018-06-02 DIAGNOSIS — R740 Nonspecific elevation of levels of transaminase and lactic acid dehydrogenase [LDH]: Secondary | ICD-10-CM

## 2018-06-02 DIAGNOSIS — M353 Polymyalgia rheumatica: Secondary | ICD-10-CM | POA: Diagnosis not present

## 2018-06-02 DIAGNOSIS — I10 Essential (primary) hypertension: Secondary | ICD-10-CM | POA: Diagnosis not present

## 2018-06-02 LAB — CBC WITH DIFFERENTIAL/PLATELET
BASOS ABS: 0 10*3/uL (ref 0.0–0.1)
Basophils Relative: 0.5 % (ref 0.0–3.0)
Eosinophils Absolute: 0 10*3/uL (ref 0.0–0.7)
Eosinophils Relative: 0.1 % (ref 0.0–5.0)
HEMATOCRIT: 34.7 % — AB (ref 36.0–46.0)
HEMOGLOBIN: 11.7 g/dL — AB (ref 12.0–15.0)
LYMPHS ABS: 1.2 10*3/uL (ref 0.7–4.0)
LYMPHS PCT: 14.5 % (ref 12.0–46.0)
MCHC: 33.6 g/dL (ref 30.0–36.0)
MCV: 85.5 fl (ref 78.0–100.0)
MONOS PCT: 5.4 % (ref 3.0–12.0)
Monocytes Absolute: 0.5 10*3/uL (ref 0.1–1.0)
Neutro Abs: 6.7 10*3/uL (ref 1.4–7.7)
Neutrophils Relative %: 79.5 % — ABNORMAL HIGH (ref 43.0–77.0)
Platelets: 230 10*3/uL (ref 150.0–400.0)
RBC: 4.05 Mil/uL (ref 3.87–5.11)
RDW: 15.5 % (ref 11.5–15.5)
WBC: 8.4 10*3/uL (ref 4.0–10.5)

## 2018-06-02 LAB — BASIC METABOLIC PANEL
BUN: 21 mg/dL (ref 6–23)
CHLORIDE: 100 meq/L (ref 96–112)
CO2: 30 mEq/L (ref 19–32)
CREATININE: 0.87 mg/dL (ref 0.40–1.20)
Calcium: 9.2 mg/dL (ref 8.4–10.5)
GFR: 66.88 mL/min (ref >60.00)
Glucose, Bld: 106 mg/dL — ABNORMAL HIGH (ref 70–99)
Potassium: 3.7 mEq/L (ref 3.5–5.1)
Sodium: 140 mEq/L (ref 135–145)

## 2018-06-02 LAB — HEPATIC FUNCTION PANEL
ALT: 23 U/L (ref 0–35)
AST: 165 U/L — ABNORMAL HIGH (ref 0–37)
Albumin: 4.2 g/dL (ref 3.5–5.2)
Alkaline Phosphatase: 235 U/L — ABNORMAL HIGH (ref 39–117)
BILIRUBIN TOTAL: 1 mg/dL (ref 0.2–1.2)
Bilirubin, Direct: 0.3 mg/dL (ref 0.0–0.3)
Total Protein: 6.4 g/dL (ref 6.0–8.3)

## 2018-06-02 LAB — POC URINALSYSI DIPSTICK (AUTOMATED)
Blood, UA: NEGATIVE
Glucose, UA: NEGATIVE
PH UA: 6 (ref 5.0–8.0)
PROTEIN UA: POSITIVE — AB
Spec Grav, UA: 1.025 (ref 1.010–1.025)
Urobilinogen, UA: 1 E.U./dL

## 2018-06-02 LAB — LIPID PANEL
CHOL/HDL RATIO: 3
Cholesterol: 224 mg/dL — ABNORMAL HIGH (ref 0–200)
HDL: 66.9 mg/dL (ref >39.00)
LDL CALC: 142 mg/dL — AB (ref 0–99)
NonHDL: 157.35
Triglycerides: 76 mg/dL (ref 0.0–149.0)
VLDL: 15.2 mg/dL (ref 0.0–40.0)

## 2018-06-02 LAB — TSH: TSH: 0.36 u[IU]/mL (ref 0.35–4.50)

## 2018-06-02 NOTE — Progress Notes (Signed)
Subjective:    Patient ID: Leah Hodges, female    DOB: 1941-05-27, 77 y.o.   MRN: [id number]  HPI Here with her son to reestablish. She had been living with her sister in [place], [place] but she recently moved back to [place] to live with her son. He wil manage all her medications. She had been taking 1 or 2 Tramadol tablets a day for joint pains, but several days ago she had hallucinations for several hours. We think she may have taken too many Tramadol, and her son wants to stop this if possible. She feels well in general. The Parkinsons makes her weak and her balance is poor. She has had several falls in the past few months, fortunately no serious injuries. Her appetite is good. Her weight is stable. She had been seeing Dr. Charlaine Dalton of [facility] in [place]. She is several years behind on mammograms and she is 4 years behind on colonoscopies. Her depression and anxiety have been well controlled.    Review of Systems  Constitutional: Negative.   HENT: Negative.   Eyes: Negative.   Respiratory: Negative.   Cardiovascular: Negative.   Gastrointestinal: Negative.   Genitourinary: Negative for decreased urine volume, difficulty urinating, dyspareunia, dysuria, enuresis, flank pain, frequency, hematuria, pelvic pain and urgency.  Musculoskeletal: Positive for arthralgias and gait problem.  Skin: Negative.   Neurological: Positive for tremors and weakness.  Psychiatric/Behavioral: Negative.        Objective:   Physical Exam  Constitutional: She is oriented to person, place, and time. She appears well-developed and well-nourished. No distress.  She is a bit shaky on her feet but she walks unassisted   HENT:  Head: Normocephalic and atraumatic.  Right Ear: External ear normal.  Left Ear: External ear normal.  Nose: Nose normal.  Mouth/Throat: Oropharynx is clear and moist. No oropharyngeal exudate.  Eyes: Pupils are equal, round, and reactive to light. Conjunctivae  and EOM are normal. No scleral icterus.  Neck: Normal range of motion. Neck supple. No JVD present. No thyromegaly present.  Cardiovascular: Normal rate, regular rhythm, normal heart sounds and intact distal pulses. Exam reveals no gallop and no friction rub.  No murmur heard. Pulmonary/Chest: Effort normal and breath sounds normal. No respiratory distress. She has no wheezes. She has no rales. She exhibits no tenderness.  Abdominal: Soft. Bowel sounds are normal. She exhibits no distension and no mass. There is no tenderness. There is no rebound and no guarding.  Musculoskeletal: Normal range of motion. She exhibits no edema or tenderness.  Lymphadenopathy:    She has no cervical adenopathy.  Neurological: She is alert and oriented to person, place, and time. She has normal reflexes. She displays normal reflexes. No cranial nerve deficit. She exhibits normal muscle tone. Coordination normal.  Skin: Skin is warm and dry. No rash noted. No erythema.  Psychiatric: She has a normal mood and affect. Her behavior is normal. Judgment and thought content normal.          Assessment & Plan:  Her Parkinsons seems to be stable. We will refer her back to Dr. Carles Collet, who she saw before she left [facility]. Her arthritis is stable. We will stop the Tramadol completely and she can use Tylenol for pain. She has a cane and I advised her to use this at all times. Her HTN is stable. Her GERD is stable. Depression and anxiety are stable. She will get fasting labs today. Set up another colonoscopy. She will set up  a mammogram soon.  [name], MD
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
## 2018-06-08 ENCOUNTER — Ambulatory Visit (INDEPENDENT_AMBULATORY_CARE_PROVIDER_SITE_OTHER): Payer: Medicare Other | Admitting: Neurology

## 2018-06-08 ENCOUNTER — Encounter: Payer: Self-pay | Admitting: Neurology

## 2018-06-08 VITALS — BP 128/62 | HR 64 | Ht 62.0 in | Wt 129.0 lb

## 2018-06-08 DIAGNOSIS — R740 Nonspecific elevation of levels of transaminase and lactic acid dehydrogenase [LDH]: Secondary | ICD-10-CM

## 2018-06-08 DIAGNOSIS — G20 Parkinson's disease: Secondary | ICD-10-CM | POA: Diagnosis not present

## 2018-06-08 DIAGNOSIS — R7401 Elevation of levels of liver transaminase levels: Secondary | ICD-10-CM

## 2018-06-08 MED ORDER — AMBULATORY NON FORMULARY MEDICATION: 0 refills | Status: AC

## 2018-06-08 NOTE — Patient Instructions (Signed)
1. No driving  2. Increase Carbidopa Levodopa 25/100 IR to 2 tablets at 7 am, 2 tablets at 11 am, and 2 tablets at 4 pm.

## 2018-06-08 NOTE — Addendum Note (Signed)
Addended by: [name] A on: 06/08/2018 08:00 AM   Modules accepted: Orders

## 2018-06-10 ENCOUNTER — Telehealth: Payer: Self-pay | Admitting: Family Medicine
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
## 2018-06-12 MED ORDER — OMEPRAZOLE 40 MG PO CPDR: DELAYED_RELEASE_CAPSULE | ORAL | 3 refills | Status: AC

## 2018-06-12 MED ORDER — AMLODIPINE BESYLATE 2.5 MG PO TABS: 2.5000 mg | ORAL_TABLET | Freq: Every day | ORAL | 3 refills | Status: AC

## 2018-06-12 NOTE — Telephone Encounter (Signed)
Per Dr Sarajane Jews the Rxs for Amlodipine and Omeprazole (which were prescribed by him) were sent to [facility].

## 2018-06-12 NOTE — Telephone Encounter (Signed)
Please refill all meds for one year

## 2018-06-25 DIAGNOSIS — R413 Other amnesia: Secondary | ICD-10-CM | POA: Diagnosis not present

## 2018-06-25 DIAGNOSIS — G20 Parkinson's disease: Secondary | ICD-10-CM | POA: Diagnosis not present

## 2018-06-29 DIAGNOSIS — D3132 Benign neoplasm of left choroid: Secondary | ICD-10-CM | POA: Diagnosis not present

## 2018-06-29 DIAGNOSIS — H524 Presbyopia: Secondary | ICD-10-CM | POA: Diagnosis not present

## 2018-07-10 DIAGNOSIS — I10 Essential (primary) hypertension: Secondary | ICD-10-CM | POA: Diagnosis not present

## 2018-07-10 DIAGNOSIS — R7303 Prediabetes: Secondary | ICD-10-CM | POA: Diagnosis not present

## 2018-07-10 DIAGNOSIS — E559 Vitamin D deficiency, unspecified: Secondary | ICD-10-CM | POA: Diagnosis not present

## 2018-07-10 DIAGNOSIS — E785 Hyperlipidemia, unspecified: Secondary | ICD-10-CM | POA: Diagnosis not present

## 2018-07-17 DIAGNOSIS — R195 Other fecal abnormalities: Secondary | ICD-10-CM | POA: Diagnosis not present

## 2018-07-17 DIAGNOSIS — R413 Other amnesia: Secondary | ICD-10-CM | POA: Diagnosis not present

## 2018-07-17 DIAGNOSIS — R918 Other nonspecific abnormal finding of lung field: Secondary | ICD-10-CM | POA: Diagnosis not present

## 2018-07-17 DIAGNOSIS — E785 Hyperlipidemia, unspecified: Secondary | ICD-10-CM | POA: Diagnosis not present

## 2018-07-17 DIAGNOSIS — I10 Essential (primary) hypertension: Secondary | ICD-10-CM | POA: Diagnosis not present

## 2018-07-17 DIAGNOSIS — Z1231 Encounter for screening mammogram for malignant neoplasm of breast: Secondary | ICD-10-CM | POA: Diagnosis not present

## 2018-07-17 DIAGNOSIS — F5101 Primary insomnia: Secondary | ICD-10-CM | POA: Diagnosis not present

## 2018-07-17 DIAGNOSIS — Z1211 Encounter for screening for malignant neoplasm of colon: Secondary | ICD-10-CM | POA: Diagnosis not present

## 2018-07-17 DIAGNOSIS — F325 Major depressive disorder, single episode, in full remission: Secondary | ICD-10-CM | POA: Diagnosis not present

## 2018-07-17 DIAGNOSIS — G20 Parkinson's disease: Secondary | ICD-10-CM | POA: Diagnosis not present

## 2018-07-17 DIAGNOSIS — K219 Gastro-esophageal reflux disease without esophagitis: Secondary | ICD-10-CM | POA: Diagnosis not present

## 2018-07-17 DIAGNOSIS — Z0000 Encounter for general adult medical examination without abnormal findings: Secondary | ICD-10-CM | POA: Diagnosis not present

## 2018-07-21 DIAGNOSIS — Z1211 Encounter for screening for malignant neoplasm of colon: Secondary | ICD-10-CM | POA: Diagnosis not present

## 2018-08-18 DIAGNOSIS — R102 Pelvic and perineal pain: Secondary | ICD-10-CM | POA: Diagnosis not present

## 2018-08-18 DIAGNOSIS — R195 Other fecal abnormalities: Secondary | ICD-10-CM | POA: Diagnosis not present

## 2018-08-18 DIAGNOSIS — R194 Change in bowel habit: Secondary | ICD-10-CM | POA: Diagnosis not present

## 2018-08-31 DIAGNOSIS — D124 Benign neoplasm of descending colon: Secondary | ICD-10-CM | POA: Diagnosis not present

## 2018-08-31 DIAGNOSIS — K5730 Diverticulosis of large intestine without perforation or abscess without bleeding: Secondary | ICD-10-CM | POA: Diagnosis not present

## 2018-08-31 DIAGNOSIS — R194 Change in bowel habit: Secondary | ICD-10-CM | POA: Diagnosis not present

## 2018-08-31 DIAGNOSIS — R195 Other fecal abnormalities: Secondary | ICD-10-CM | POA: Diagnosis not present

## 2018-08-31 DIAGNOSIS — K635 Polyp of colon: Secondary | ICD-10-CM | POA: Diagnosis not present

## 2018-09-10 DIAGNOSIS — G20 Parkinson's disease: Secondary | ICD-10-CM | POA: Diagnosis not present

## 2018-09-10 DIAGNOSIS — F0280 Dementia in other diseases classified elsewhere without behavioral disturbance: Secondary | ICD-10-CM | POA: Diagnosis not present

## 2018-09-10 DIAGNOSIS — G903 Multi-system degeneration of the autonomic nervous system: Secondary | ICD-10-CM | POA: Diagnosis not present

## 2018-09-17 DIAGNOSIS — G20 Parkinson's disease: Secondary | ICD-10-CM | POA: Diagnosis not present

## 2018-09-17 DIAGNOSIS — M5430 Sciatica, unspecified side: Secondary | ICD-10-CM | POA: Diagnosis not present

## 2018-09-17 DIAGNOSIS — M47816 Spondylosis without myelopathy or radiculopathy, lumbar region: Secondary | ICD-10-CM | POA: Diagnosis not present

## 2018-09-17 DIAGNOSIS — F0280 Dementia in other diseases classified elsewhere without behavioral disturbance: Secondary | ICD-10-CM | POA: Diagnosis not present

## 2018-10-08 DIAGNOSIS — M79672 Pain in left foot: Secondary | ICD-10-CM | POA: Diagnosis not present

## 2018-10-08 DIAGNOSIS — G5792 Unspecified mononeuropathy of left lower limb: Secondary | ICD-10-CM | POA: Diagnosis not present

## 2018-10-08 DIAGNOSIS — M79671 Pain in right foot: Secondary | ICD-10-CM | POA: Diagnosis not present
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
## 2018-10-26 ENCOUNTER — Ambulatory Visit: Payer: Medicare Other | Admitting: Neurology

## 2018-11-10 DIAGNOSIS — R202 Paresthesia of skin: Secondary | ICD-10-CM | POA: Diagnosis not present

## 2018-11-10 DIAGNOSIS — M545 Low back pain: Secondary | ICD-10-CM | POA: Diagnosis not present

## 2018-12-15 DIAGNOSIS — G20 Parkinson's disease: Secondary | ICD-10-CM | POA: Diagnosis not present

## 2018-12-16 DIAGNOSIS — G20 Parkinson's disease: Secondary | ICD-10-CM | POA: Diagnosis not present

## 2018-12-16 DIAGNOSIS — F325 Major depressive disorder, single episode, in full remission: Secondary | ICD-10-CM | POA: Diagnosis not present

## 2018-12-16 DIAGNOSIS — F0280 Dementia in other diseases classified elsewhere without behavioral disturbance: Secondary | ICD-10-CM | POA: Diagnosis not present

## 2018-12-28 DIAGNOSIS — Z111 Encounter for screening for respiratory tuberculosis: Secondary | ICD-10-CM | POA: Diagnosis not present

## 2019-01-15 DIAGNOSIS — I10 Essential (primary) hypertension: Secondary | ICD-10-CM | POA: Diagnosis not present

## 2019-01-15 DIAGNOSIS — G8929 Other chronic pain: Secondary | ICD-10-CM | POA: Diagnosis not present

## 2019-01-15 DIAGNOSIS — K219 Gastro-esophageal reflux disease without esophagitis: Secondary | ICD-10-CM | POA: Diagnosis not present

## 2019-01-15 DIAGNOSIS — E559 Vitamin D deficiency, unspecified: Secondary | ICD-10-CM | POA: Diagnosis not present

## 2019-01-15 DIAGNOSIS — M4696 Unspecified inflammatory spondylopathy, lumbar region: Secondary | ICD-10-CM | POA: Diagnosis not present

## 2019-01-15 DIAGNOSIS — M353 Polymyalgia rheumatica: Secondary | ICD-10-CM | POA: Diagnosis not present

## 2019-01-15 DIAGNOSIS — R1310 Dysphagia, unspecified: Secondary | ICD-10-CM | POA: Diagnosis not present

## 2019-01-15 DIAGNOSIS — F0280 Dementia in other diseases classified elsewhere without behavioral disturbance: Secondary | ICD-10-CM | POA: Diagnosis not present

## 2019-01-15 DIAGNOSIS — F325 Major depressive disorder, single episode, in full remission: Secondary | ICD-10-CM | POA: Diagnosis not present

## 2019-01-15 DIAGNOSIS — Z9181 History of falling: Secondary | ICD-10-CM | POA: Diagnosis not present

## 2019-01-15 DIAGNOSIS — G20 Parkinson's disease: Secondary | ICD-10-CM | POA: Diagnosis not present

## 2019-01-15 DIAGNOSIS — K5790 Diverticulosis of intestine, part unspecified, without perforation or abscess without bleeding: Secondary | ICD-10-CM | POA: Diagnosis not present

## 2019-01-15 DIAGNOSIS — Z96652 Presence of left artificial knee joint: Secondary | ICD-10-CM | POA: Diagnosis not present

## 2019-01-20 DIAGNOSIS — M4696 Unspecified inflammatory spondylopathy, lumbar region: Secondary | ICD-10-CM | POA: Diagnosis not present

## 2019-01-20 DIAGNOSIS — F325 Major depressive disorder, single episode, in full remission: Secondary | ICD-10-CM | POA: Diagnosis not present

## 2019-01-20 DIAGNOSIS — I10 Essential (primary) hypertension: Secondary | ICD-10-CM | POA: Diagnosis not present

## 2019-01-20 DIAGNOSIS — F0280 Dementia in other diseases classified elsewhere without behavioral disturbance: Secondary | ICD-10-CM | POA: Diagnosis not present

## 2019-01-20 DIAGNOSIS — G8929 Other chronic pain: Secondary | ICD-10-CM | POA: Diagnosis not present

## 2019-01-20 DIAGNOSIS — G20 Parkinson's disease: Secondary | ICD-10-CM | POA: Diagnosis not present

## 2019-01-22 DIAGNOSIS — G20 Parkinson's disease: Secondary | ICD-10-CM | POA: Diagnosis not present

## 2019-01-22 DIAGNOSIS — F325 Major depressive disorder, single episode, in full remission: Secondary | ICD-10-CM | POA: Diagnosis not present

## 2019-01-22 DIAGNOSIS — F0280 Dementia in other diseases classified elsewhere without behavioral disturbance: Secondary | ICD-10-CM | POA: Diagnosis not present

## 2019-01-22 DIAGNOSIS — I10 Essential (primary) hypertension: Secondary | ICD-10-CM | POA: Diagnosis not present

## 2019-01-22 DIAGNOSIS — G8929 Other chronic pain: Secondary | ICD-10-CM | POA: Diagnosis not present

## 2019-01-22 DIAGNOSIS — M4696 Unspecified inflammatory spondylopathy, lumbar region: Secondary | ICD-10-CM | POA: Diagnosis not present

## 2019-01-26 DIAGNOSIS — M4696 Unspecified inflammatory spondylopathy, lumbar region: Secondary | ICD-10-CM | POA: Diagnosis not present

## 2019-01-26 DIAGNOSIS — G20 Parkinson's disease: Secondary | ICD-10-CM | POA: Diagnosis not present

## 2019-01-26 DIAGNOSIS — I10 Essential (primary) hypertension: Secondary | ICD-10-CM | POA: Diagnosis not present

## 2019-01-26 DIAGNOSIS — F325 Major depressive disorder, single episode, in full remission: Secondary | ICD-10-CM | POA: Diagnosis not present

## 2019-01-26 DIAGNOSIS — F0280 Dementia in other diseases classified elsewhere without behavioral disturbance: Secondary | ICD-10-CM | POA: Diagnosis not present

## 2019-01-26 DIAGNOSIS — G8929 Other chronic pain: Secondary | ICD-10-CM | POA: Diagnosis not present

## 2019-01-28 DIAGNOSIS — G20 Parkinson's disease: Secondary | ICD-10-CM | POA: Diagnosis not present

## 2019-01-28 DIAGNOSIS — F0280 Dementia in other diseases classified elsewhere without behavioral disturbance: Secondary | ICD-10-CM | POA: Diagnosis not present

## 2019-01-28 DIAGNOSIS — M4696 Unspecified inflammatory spondylopathy, lumbar region: Secondary | ICD-10-CM | POA: Diagnosis not present

## 2019-01-28 DIAGNOSIS — I10 Essential (primary) hypertension: Secondary | ICD-10-CM | POA: Diagnosis not present

## 2019-01-28 DIAGNOSIS — G8929 Other chronic pain: Secondary | ICD-10-CM | POA: Diagnosis not present

## 2019-01-28 DIAGNOSIS — F325 Major depressive disorder, single episode, in full remission: Secondary | ICD-10-CM | POA: Diagnosis not present

## 2019-01-31 DIAGNOSIS — M1612 Unilateral primary osteoarthritis, left hip: Secondary | ICD-10-CM | POA: Diagnosis not present

## 2019-01-31 DIAGNOSIS — S79912A Unspecified injury of left hip, initial encounter: Secondary | ICD-10-CM | POA: Diagnosis not present

## 2019-01-31 DIAGNOSIS — Z79899 Other long term (current) drug therapy: Secondary | ICD-10-CM | POA: Diagnosis not present

## 2019-01-31 DIAGNOSIS — G20 Parkinson's disease: Secondary | ICD-10-CM | POA: Diagnosis not present

## 2019-01-31 DIAGNOSIS — S99922A Unspecified injury of left foot, initial encounter: Secondary | ICD-10-CM | POA: Diagnosis not present

## 2019-01-31 DIAGNOSIS — Z7982 Long term (current) use of aspirin: Secondary | ICD-10-CM | POA: Diagnosis not present

## 2019-01-31 DIAGNOSIS — F05 Delirium due to known physiological condition: Secondary | ICD-10-CM | POA: Diagnosis not present

## 2019-01-31 DIAGNOSIS — M1990 Unspecified osteoarthritis, unspecified site: Secondary | ICD-10-CM | POA: Diagnosis not present

## 2019-01-31 DIAGNOSIS — R7989 Other specified abnormal findings of blood chemistry: Secondary | ICD-10-CM | POA: Diagnosis not present

## 2019-01-31 DIAGNOSIS — M85872 Other specified disorders of bone density and structure, left ankle and foot: Secondary | ICD-10-CM | POA: Diagnosis not present

## 2019-01-31 DIAGNOSIS — R4689 Other symptoms and signs involving appearance and behavior: Secondary | ICD-10-CM | POA: Diagnosis not present

## 2019-01-31 DIAGNOSIS — N3000 Acute cystitis without hematuria: Secondary | ICD-10-CM | POA: Diagnosis not present

## 2019-01-31 DIAGNOSIS — I10 Essential (primary) hypertension: Secondary | ICD-10-CM | POA: Diagnosis not present

## 2019-01-31 DIAGNOSIS — D649 Anemia, unspecified: Secondary | ICD-10-CM | POA: Diagnosis not present

## 2019-01-31 DIAGNOSIS — F0281 Dementia in other diseases classified elsewhere with behavioral disturbance: Secondary | ICD-10-CM | POA: Diagnosis not present

## 2019-02-04 DIAGNOSIS — Z79899 Other long term (current) drug therapy: Secondary | ICD-10-CM | POA: Diagnosis not present

## 2019-02-04 DIAGNOSIS — I10 Essential (primary) hypertension: Secondary | ICD-10-CM | POA: Diagnosis not present

## 2019-02-04 DIAGNOSIS — F0281 Dementia in other diseases classified elsewhere with behavioral disturbance: Secondary | ICD-10-CM | POA: Diagnosis not present

## 2019-02-04 DIAGNOSIS — M1990 Unspecified osteoarthritis, unspecified site: Secondary | ICD-10-CM | POA: Diagnosis not present

## 2019-02-04 DIAGNOSIS — N3000 Acute cystitis without hematuria: Secondary | ICD-10-CM | POA: Diagnosis not present

## 2019-02-04 DIAGNOSIS — F05 Delirium due to known physiological condition: Secondary | ICD-10-CM | POA: Diagnosis not present

## 2019-02-04 DIAGNOSIS — G20 Parkinson's disease: Secondary | ICD-10-CM | POA: Diagnosis not present

## 2019-02-04 DIAGNOSIS — D649 Anemia, unspecified: Secondary | ICD-10-CM | POA: Diagnosis not present

## 2019-02-04 DIAGNOSIS — Z7982 Long term (current) use of aspirin: Secondary | ICD-10-CM | POA: Diagnosis not present

## 2019-02-06 DIAGNOSIS — M1612 Unilateral primary osteoarthritis, left hip: Secondary | ICD-10-CM | POA: Diagnosis not present

## 2019-02-06 DIAGNOSIS — S79912A Unspecified injury of left hip, initial encounter: Secondary | ICD-10-CM | POA: Diagnosis not present

## 2019-02-18 DIAGNOSIS — F05 Delirium due to known physiological condition: Secondary | ICD-10-CM | POA: Diagnosis not present

## 2019-02-18 DIAGNOSIS — N3000 Acute cystitis without hematuria: Secondary | ICD-10-CM | POA: Diagnosis not present

## 2019-02-18 DIAGNOSIS — Z8669 Personal history of other diseases of the nervous system and sense organs: Secondary | ICD-10-CM | POA: Diagnosis not present

## 2019-02-18 DIAGNOSIS — I10 Essential (primary) hypertension: Secondary | ICD-10-CM | POA: Diagnosis not present

## 2019-02-19 DIAGNOSIS — N390 Urinary tract infection, site not specified: Secondary | ICD-10-CM | POA: Diagnosis not present

## 2019-02-19 DIAGNOSIS — G20 Parkinson's disease: Secondary | ICD-10-CM | POA: Diagnosis not present

## 2019-02-19 DIAGNOSIS — I10 Essential (primary) hypertension: Secondary | ICD-10-CM | POA: Diagnosis not present

## 2019-02-19 DIAGNOSIS — F0280 Dementia in other diseases classified elsewhere without behavioral disturbance: Secondary | ICD-10-CM | POA: Diagnosis not present

## 2019-02-20 DIAGNOSIS — N3000 Acute cystitis without hematuria: Secondary | ICD-10-CM | POA: Diagnosis not present

## 2019-02-20 DIAGNOSIS — I10 Essential (primary) hypertension: Secondary | ICD-10-CM | POA: Diagnosis not present

## 2019-02-20 DIAGNOSIS — R001 Bradycardia, unspecified: Secondary | ICD-10-CM | POA: Diagnosis not present

## 2019-02-20 DIAGNOSIS — G20 Parkinson's disease: Secondary | ICD-10-CM | POA: Diagnosis not present

## 2019-02-20 DIAGNOSIS — F0280 Dementia in other diseases classified elsewhere without behavioral disturbance: Secondary | ICD-10-CM | POA: Diagnosis not present

## 2019-02-21 DIAGNOSIS — G20 Parkinson's disease: Secondary | ICD-10-CM | POA: Diagnosis not present

## 2019-02-21 DIAGNOSIS — N3000 Acute cystitis without hematuria: Secondary | ICD-10-CM | POA: Diagnosis not present

## 2019-02-21 DIAGNOSIS — I10 Essential (primary) hypertension: Secondary | ICD-10-CM | POA: Diagnosis not present

## 2019-02-21 DIAGNOSIS — F0280 Dementia in other diseases classified elsewhere without behavioral disturbance: Secondary | ICD-10-CM | POA: Diagnosis not present

## 2019-02-22 DIAGNOSIS — G20 Parkinson's disease: Secondary | ICD-10-CM | POA: Diagnosis not present

## 2019-02-22 DIAGNOSIS — N3000 Acute cystitis without hematuria: Secondary | ICD-10-CM | POA: Diagnosis not present

## 2019-02-22 DIAGNOSIS — I10 Essential (primary) hypertension: Secondary | ICD-10-CM | POA: Diagnosis not present

## 2019-02-22 DIAGNOSIS — F0280 Dementia in other diseases classified elsewhere without behavioral disturbance: Secondary | ICD-10-CM | POA: Diagnosis not present

## 2019-02-23 DIAGNOSIS — N3000 Acute cystitis without hematuria: Secondary | ICD-10-CM | POA: Diagnosis not present

## 2019-02-23 DIAGNOSIS — G20 Parkinson's disease: Secondary | ICD-10-CM | POA: Diagnosis not present

## 2019-02-23 DIAGNOSIS — F0280 Dementia in other diseases classified elsewhere without behavioral disturbance: Secondary | ICD-10-CM | POA: Diagnosis not present

## 2019-02-23 DIAGNOSIS — I10 Essential (primary) hypertension: Secondary | ICD-10-CM | POA: Diagnosis not present

## 2019-02-24 DIAGNOSIS — G20 Parkinson's disease: Secondary | ICD-10-CM | POA: Diagnosis not present

## 2019-02-24 DIAGNOSIS — F0280 Dementia in other diseases classified elsewhere without behavioral disturbance: Secondary | ICD-10-CM | POA: Diagnosis not present

## 2019-02-24 DIAGNOSIS — N3000 Acute cystitis without hematuria: Secondary | ICD-10-CM | POA: Diagnosis not present

## 2019-02-24 DIAGNOSIS — I10 Essential (primary) hypertension: Secondary | ICD-10-CM | POA: Diagnosis not present

## 2019-02-25 DIAGNOSIS — I10 Essential (primary) hypertension: Secondary | ICD-10-CM | POA: Diagnosis not present

## 2019-02-25 DIAGNOSIS — F0280 Dementia in other diseases classified elsewhere without behavioral disturbance: Secondary | ICD-10-CM | POA: Diagnosis not present

## 2019-02-25 DIAGNOSIS — D649 Anemia, unspecified: Secondary | ICD-10-CM | POA: Diagnosis not present

## 2019-02-25 DIAGNOSIS — E870 Hyperosmolality and hypernatremia: Secondary | ICD-10-CM | POA: Diagnosis not present

## 2019-02-25 DIAGNOSIS — F05 Delirium due to known physiological condition: Secondary | ICD-10-CM | POA: Diagnosis not present

## 2019-02-25 DIAGNOSIS — N3000 Acute cystitis without hematuria: Secondary | ICD-10-CM | POA: Diagnosis not present

## 2019-02-25 DIAGNOSIS — G20 Parkinson's disease: Secondary | ICD-10-CM | POA: Diagnosis not present

## 2019-02-26 DIAGNOSIS — E870 Hyperosmolality and hypernatremia: Secondary | ICD-10-CM | POA: Diagnosis not present

## 2019-02-26 DIAGNOSIS — D649 Anemia, unspecified: Secondary | ICD-10-CM | POA: Diagnosis not present

## 2019-02-26 DIAGNOSIS — F0280 Dementia in other diseases classified elsewhere without behavioral disturbance: Secondary | ICD-10-CM | POA: Diagnosis not present

## 2019-02-26 DIAGNOSIS — G20 Parkinson's disease: Secondary | ICD-10-CM | POA: Diagnosis not present

## 2019-02-26 DIAGNOSIS — F05 Delirium due to known physiological condition: Secondary | ICD-10-CM | POA: Diagnosis not present

## 2019-02-26 DIAGNOSIS — N3000 Acute cystitis without hematuria: Secondary | ICD-10-CM | POA: Diagnosis not present

## 2019-02-26 DIAGNOSIS — I10 Essential (primary) hypertension: Secondary | ICD-10-CM | POA: Diagnosis not present

## 2019-02-27 DIAGNOSIS — N3000 Acute cystitis without hematuria: Secondary | ICD-10-CM | POA: Diagnosis not present

## 2019-02-27 DIAGNOSIS — I10 Essential (primary) hypertension: Secondary | ICD-10-CM | POA: Diagnosis not present

## 2019-02-27 DIAGNOSIS — E870 Hyperosmolality and hypernatremia: Secondary | ICD-10-CM | POA: Diagnosis not present

## 2019-02-27 DIAGNOSIS — G20 Parkinson's disease: Secondary | ICD-10-CM | POA: Diagnosis not present

## 2019-02-27 DIAGNOSIS — F05 Delirium due to known physiological condition: Secondary | ICD-10-CM | POA: Diagnosis not present

## 2019-02-27 DIAGNOSIS — F0280 Dementia in other diseases classified elsewhere without behavioral disturbance: Secondary | ICD-10-CM | POA: Diagnosis not present

## 2019-02-27 DIAGNOSIS — D649 Anemia, unspecified: Secondary | ICD-10-CM | POA: Diagnosis not present

## 2019-02-28 DIAGNOSIS — D649 Anemia, unspecified: Secondary | ICD-10-CM | POA: Diagnosis not present

## 2019-02-28 DIAGNOSIS — E870 Hyperosmolality and hypernatremia: Secondary | ICD-10-CM | POA: Diagnosis not present

## 2019-02-28 DIAGNOSIS — N3000 Acute cystitis without hematuria: Secondary | ICD-10-CM | POA: Diagnosis not present

## 2019-02-28 DIAGNOSIS — G20 Parkinson's disease: Secondary | ICD-10-CM | POA: Diagnosis not present

## 2019-02-28 DIAGNOSIS — F0280 Dementia in other diseases classified elsewhere without behavioral disturbance: Secondary | ICD-10-CM | POA: Diagnosis not present

## 2019-02-28 DIAGNOSIS — I10 Essential (primary) hypertension: Secondary | ICD-10-CM | POA: Diagnosis not present

## 2019-02-28 DIAGNOSIS — F05 Delirium due to known physiological condition: Secondary | ICD-10-CM | POA: Diagnosis not present

## 2019-03-01 DIAGNOSIS — F05 Delirium due to known physiological condition: Secondary | ICD-10-CM | POA: Diagnosis not present

## 2019-03-01 DIAGNOSIS — N3000 Acute cystitis without hematuria: Secondary | ICD-10-CM | POA: Diagnosis not present

## 2019-03-01 DIAGNOSIS — E870 Hyperosmolality and hypernatremia: Secondary | ICD-10-CM | POA: Diagnosis not present

## 2019-03-01 DIAGNOSIS — I10 Essential (primary) hypertension: Secondary | ICD-10-CM | POA: Diagnosis not present

## 2019-03-01 DIAGNOSIS — G20 Parkinson's disease: Secondary | ICD-10-CM | POA: Diagnosis not present

## 2019-03-01 DIAGNOSIS — F0280 Dementia in other diseases classified elsewhere without behavioral disturbance: Secondary | ICD-10-CM | POA: Diagnosis not present

## 2019-03-01 DIAGNOSIS — D649 Anemia, unspecified: Secondary | ICD-10-CM | POA: Diagnosis not present

## 2019-03-02 DIAGNOSIS — E870 Hyperosmolality and hypernatremia: Secondary | ICD-10-CM | POA: Diagnosis not present

## 2019-03-02 DIAGNOSIS — N3000 Acute cystitis without hematuria: Secondary | ICD-10-CM | POA: Diagnosis not present

## 2019-03-02 DIAGNOSIS — F0280 Dementia in other diseases classified elsewhere without behavioral disturbance: Secondary | ICD-10-CM | POA: Diagnosis not present

## 2019-03-02 DIAGNOSIS — G20 Parkinson's disease: Secondary | ICD-10-CM | POA: Diagnosis not present

## 2019-03-02 DIAGNOSIS — I10 Essential (primary) hypertension: Secondary | ICD-10-CM | POA: Diagnosis not present

## 2019-03-02 DIAGNOSIS — D649 Anemia, unspecified: Secondary | ICD-10-CM | POA: Diagnosis not present

## 2019-03-02 DIAGNOSIS — F05 Delirium due to known physiological condition: Secondary | ICD-10-CM | POA: Diagnosis not present

## 2019-03-03 DIAGNOSIS — N3000 Acute cystitis without hematuria: Secondary | ICD-10-CM | POA: Diagnosis not present

## 2019-03-03 DIAGNOSIS — F0280 Dementia in other diseases classified elsewhere without behavioral disturbance: Secondary | ICD-10-CM | POA: Diagnosis not present

## 2019-03-03 DIAGNOSIS — G20 Parkinson's disease: Secondary | ICD-10-CM | POA: Diagnosis not present

## 2019-03-04 DIAGNOSIS — G20 Parkinson's disease: Secondary | ICD-10-CM | POA: Diagnosis not present

## 2019-03-04 DIAGNOSIS — N3000 Acute cystitis without hematuria: Secondary | ICD-10-CM | POA: Diagnosis not present

## 2019-03-04 DIAGNOSIS — F0280 Dementia in other diseases classified elsewhere without behavioral disturbance: Secondary | ICD-10-CM | POA: Diagnosis not present

## 2019-03-05 DIAGNOSIS — F0280 Dementia in other diseases classified elsewhere without behavioral disturbance: Secondary | ICD-10-CM | POA: Diagnosis not present

## 2019-03-05 DIAGNOSIS — N3000 Acute cystitis without hematuria: Secondary | ICD-10-CM | POA: Diagnosis not present

## 2019-03-05 DIAGNOSIS — G20 Parkinson's disease: Secondary | ICD-10-CM | POA: Diagnosis not present

## 2019-03-06 DIAGNOSIS — F0280 Dementia in other diseases classified elsewhere without behavioral disturbance: Secondary | ICD-10-CM | POA: Diagnosis not present

## 2019-03-06 DIAGNOSIS — G20 Parkinson's disease: Secondary | ICD-10-CM | POA: Diagnosis not present

## 2019-03-06 DIAGNOSIS — N3000 Acute cystitis without hematuria: Secondary | ICD-10-CM | POA: Diagnosis not present

## 2019-03-07 DIAGNOSIS — N3000 Acute cystitis without hematuria: Secondary | ICD-10-CM | POA: Diagnosis not present

## 2019-03-07 DIAGNOSIS — F0280 Dementia in other diseases classified elsewhere without behavioral disturbance: Secondary | ICD-10-CM | POA: Diagnosis not present

## 2019-03-07 DIAGNOSIS — G20 Parkinson's disease: Secondary | ICD-10-CM | POA: Diagnosis not present

## 2019-03-08 DIAGNOSIS — G20 Parkinson's disease: Secondary | ICD-10-CM | POA: Diagnosis not present

## 2019-03-08 DIAGNOSIS — F0280 Dementia in other diseases classified elsewhere without behavioral disturbance: Secondary | ICD-10-CM | POA: Diagnosis not present

## 2019-03-09 DIAGNOSIS — I10 Essential (primary) hypertension: Secondary | ICD-10-CM | POA: Diagnosis not present

## 2019-03-09 DIAGNOSIS — G20 Parkinson's disease: Secondary | ICD-10-CM | POA: Diagnosis not present

## 2019-03-09 DIAGNOSIS — E870 Hyperosmolality and hypernatremia: Secondary | ICD-10-CM | POA: Diagnosis not present

## 2019-03-09 DIAGNOSIS — D649 Anemia, unspecified: Secondary | ICD-10-CM | POA: Diagnosis not present

## 2019-03-09 DIAGNOSIS — F0280 Dementia in other diseases classified elsewhere without behavioral disturbance: Secondary | ICD-10-CM | POA: Diagnosis not present

## 2019-03-09 DIAGNOSIS — N3000 Acute cystitis without hematuria: Secondary | ICD-10-CM | POA: Diagnosis not present

## 2019-03-10 DIAGNOSIS — I10 Essential (primary) hypertension: Secondary | ICD-10-CM | POA: Diagnosis not present

## 2019-03-10 DIAGNOSIS — G20 Parkinson's disease: Secondary | ICD-10-CM | POA: Diagnosis not present

## 2019-03-10 DIAGNOSIS — D649 Anemia, unspecified: Secondary | ICD-10-CM | POA: Diagnosis not present

## 2019-03-10 DIAGNOSIS — N3000 Acute cystitis without hematuria: Secondary | ICD-10-CM | POA: Diagnosis not present

## 2019-03-10 DIAGNOSIS — E870 Hyperosmolality and hypernatremia: Secondary | ICD-10-CM | POA: Diagnosis not present

## 2019-03-10 DIAGNOSIS — F0280 Dementia in other diseases classified elsewhere without behavioral disturbance: Secondary | ICD-10-CM | POA: Diagnosis not present

## 2019-03-11 DIAGNOSIS — N3000 Acute cystitis without hematuria: Secondary | ICD-10-CM | POA: Diagnosis not present

## 2019-03-11 DIAGNOSIS — G20 Parkinson's disease: Secondary | ICD-10-CM | POA: Diagnosis not present

## 2019-03-11 DIAGNOSIS — D649 Anemia, unspecified: Secondary | ICD-10-CM | POA: Diagnosis not present

## 2019-03-11 DIAGNOSIS — E870 Hyperosmolality and hypernatremia: Secondary | ICD-10-CM | POA: Diagnosis not present

## 2019-03-11 DIAGNOSIS — I10 Essential (primary) hypertension: Secondary | ICD-10-CM | POA: Diagnosis not present

## 2019-03-11 DIAGNOSIS — F0280 Dementia in other diseases classified elsewhere without behavioral disturbance: Secondary | ICD-10-CM | POA: Diagnosis not present

## 2019-03-12 DIAGNOSIS — N3000 Acute cystitis without hematuria: Secondary | ICD-10-CM | POA: Diagnosis not present

## 2019-03-12 DIAGNOSIS — G20 Parkinson's disease: Secondary | ICD-10-CM | POA: Diagnosis not present

## 2019-03-12 DIAGNOSIS — D649 Anemia, unspecified: Secondary | ICD-10-CM | POA: Diagnosis not present

## 2019-03-12 DIAGNOSIS — E870 Hyperosmolality and hypernatremia: Secondary | ICD-10-CM | POA: Diagnosis not present

## 2019-03-12 DIAGNOSIS — F0280 Dementia in other diseases classified elsewhere without behavioral disturbance: Secondary | ICD-10-CM | POA: Diagnosis not present

## 2019-03-12 DIAGNOSIS — I10 Essential (primary) hypertension: Secondary | ICD-10-CM | POA: Diagnosis not present

## 2019-03-13 DIAGNOSIS — G20 Parkinson's disease: Secondary | ICD-10-CM | POA: Diagnosis not present

## 2019-03-13 DIAGNOSIS — E870 Hyperosmolality and hypernatremia: Secondary | ICD-10-CM | POA: Diagnosis not present

## 2019-03-13 DIAGNOSIS — D649 Anemia, unspecified: Secondary | ICD-10-CM | POA: Diagnosis not present

## 2019-03-13 DIAGNOSIS — N3000 Acute cystitis without hematuria: Secondary | ICD-10-CM | POA: Diagnosis not present

## 2019-03-13 DIAGNOSIS — F0280 Dementia in other diseases classified elsewhere without behavioral disturbance: Secondary | ICD-10-CM | POA: Diagnosis not present

## 2019-03-13 DIAGNOSIS — I10 Essential (primary) hypertension: Secondary | ICD-10-CM | POA: Diagnosis not present

## 2019-03-14 DIAGNOSIS — D649 Anemia, unspecified: Secondary | ICD-10-CM | POA: Diagnosis not present

## 2019-03-14 DIAGNOSIS — I10 Essential (primary) hypertension: Secondary | ICD-10-CM | POA: Diagnosis not present

## 2019-03-14 DIAGNOSIS — F0280 Dementia in other diseases classified elsewhere without behavioral disturbance: Secondary | ICD-10-CM | POA: Diagnosis not present

## 2019-03-14 DIAGNOSIS — N3000 Acute cystitis without hematuria: Secondary | ICD-10-CM | POA: Diagnosis not present

## 2019-03-14 DIAGNOSIS — G20 Parkinson's disease: Secondary | ICD-10-CM | POA: Diagnosis not present

## 2019-03-14 DIAGNOSIS — E870 Hyperosmolality and hypernatremia: Secondary | ICD-10-CM | POA: Diagnosis not present

## 2019-03-15 DIAGNOSIS — N3000 Acute cystitis without hematuria: Secondary | ICD-10-CM | POA: Diagnosis not present

## 2019-03-15 DIAGNOSIS — F0280 Dementia in other diseases classified elsewhere without behavioral disturbance: Secondary | ICD-10-CM | POA: Diagnosis not present

## 2019-03-15 DIAGNOSIS — G20 Parkinson's disease: Secondary | ICD-10-CM | POA: Diagnosis not present

## 2019-03-15 DIAGNOSIS — I10 Essential (primary) hypertension: Secondary | ICD-10-CM | POA: Diagnosis not present

## 2019-03-17 DIAGNOSIS — Z7689 Persons encountering health services in other specified circumstances: Secondary | ICD-10-CM | POA: Diagnosis not present

## 2019-03-17 DIAGNOSIS — G20 Parkinson's disease: Secondary | ICD-10-CM | POA: Diagnosis not present

## 2019-03-17 DIAGNOSIS — F0390 Unspecified dementia without behavioral disturbance: Secondary | ICD-10-CM | POA: Diagnosis not present

## 2019-03-29 DIAGNOSIS — G20 Parkinson's disease: Secondary | ICD-10-CM | POA: Diagnosis not present

## 2019-03-29 DIAGNOSIS — F0390 Unspecified dementia without behavioral disturbance: Secondary | ICD-10-CM | POA: Diagnosis not present

## 2019-04-14 DIAGNOSIS — R350 Frequency of micturition: Secondary | ICD-10-CM | POA: Diagnosis not present

## 2019-04-14 DIAGNOSIS — R110 Nausea: Secondary | ICD-10-CM | POA: Diagnosis not present

## 2019-04-14 DIAGNOSIS — R197 Diarrhea, unspecified: Secondary | ICD-10-CM | POA: Diagnosis not present

## 2019-04-15 DIAGNOSIS — R197 Diarrhea, unspecified: Secondary | ICD-10-CM | POA: Diagnosis not present

## 2019-04-16 DIAGNOSIS — Z9181 History of falling: Secondary | ICD-10-CM | POA: Diagnosis not present

## 2019-04-16 DIAGNOSIS — Z96659 Presence of unspecified artificial knee joint: Secondary | ICD-10-CM | POA: Diagnosis not present

## 2019-04-16 DIAGNOSIS — I10 Essential (primary) hypertension: Secondary | ICD-10-CM | POA: Diagnosis not present

## 2019-04-16 DIAGNOSIS — F0280 Dementia in other diseases classified elsewhere without behavioral disturbance: Secondary | ICD-10-CM | POA: Diagnosis not present

## 2019-04-16 DIAGNOSIS — G20 Parkinson's disease: Secondary | ICD-10-CM | POA: Diagnosis not present

## 2019-04-17 DIAGNOSIS — R1032 Left lower quadrant pain: Secondary | ICD-10-CM | POA: Diagnosis not present

## 2019-04-17 DIAGNOSIS — G20 Parkinson's disease: Secondary | ICD-10-CM | POA: Diagnosis not present

## 2019-04-17 DIAGNOSIS — R1031 Right lower quadrant pain: Secondary | ICD-10-CM | POA: Diagnosis not present

## 2019-04-17 DIAGNOSIS — R109 Unspecified abdominal pain: Secondary | ICD-10-CM | POA: Diagnosis not present

## 2019-04-17 DIAGNOSIS — R399 Unspecified symptoms and signs involving the genitourinary system: Secondary | ICD-10-CM | POA: Diagnosis not present

## 2019-04-17 DIAGNOSIS — K625 Hemorrhage of anus and rectum: Secondary | ICD-10-CM | POA: Diagnosis not present

## 2019-04-17 DIAGNOSIS — R350 Frequency of micturition: Secondary | ICD-10-CM | POA: Diagnosis not present

## 2019-04-17 DIAGNOSIS — N390 Urinary tract infection, site not specified: Secondary | ICD-10-CM | POA: Diagnosis not present

## 2019-04-17 DIAGNOSIS — R197 Diarrhea, unspecified: Secondary | ICD-10-CM | POA: Diagnosis not present

## 2019-04-17 DIAGNOSIS — R1030 Lower abdominal pain, unspecified: Secondary | ICD-10-CM | POA: Diagnosis not present

## 2019-04-19 DIAGNOSIS — G20 Parkinson's disease: Secondary | ICD-10-CM | POA: Diagnosis not present

## 2019-04-19 DIAGNOSIS — Z9181 History of falling: Secondary | ICD-10-CM | POA: Diagnosis not present

## 2019-04-19 DIAGNOSIS — Z96659 Presence of unspecified artificial knee joint: Secondary | ICD-10-CM | POA: Diagnosis not present

## 2019-04-19 DIAGNOSIS — F0280 Dementia in other diseases classified elsewhere without behavioral disturbance: Secondary | ICD-10-CM | POA: Diagnosis not present

## 2019-04-19 DIAGNOSIS — I10 Essential (primary) hypertension: Secondary | ICD-10-CM | POA: Diagnosis not present

## 2019-04-23 DIAGNOSIS — F0280 Dementia in other diseases classified elsewhere without behavioral disturbance: Secondary | ICD-10-CM | POA: Diagnosis not present

## 2019-04-23 DIAGNOSIS — Z9181 History of falling: Secondary | ICD-10-CM | POA: Diagnosis not present

## 2019-04-23 DIAGNOSIS — Z96659 Presence of unspecified artificial knee joint: Secondary | ICD-10-CM | POA: Diagnosis not present

## 2019-04-23 DIAGNOSIS — G20 Parkinson's disease: Secondary | ICD-10-CM | POA: Diagnosis not present

## 2019-04-23 DIAGNOSIS — I10 Essential (primary) hypertension: Secondary | ICD-10-CM | POA: Diagnosis not present

## 2019-04-26 DIAGNOSIS — Z9181 History of falling: Secondary | ICD-10-CM | POA: Diagnosis not present

## 2019-04-26 DIAGNOSIS — G20 Parkinson's disease: Secondary | ICD-10-CM | POA: Diagnosis not present

## 2019-04-26 DIAGNOSIS — F0280 Dementia in other diseases classified elsewhere without behavioral disturbance: Secondary | ICD-10-CM | POA: Diagnosis not present

## 2019-04-26 DIAGNOSIS — I10 Essential (primary) hypertension: Secondary | ICD-10-CM | POA: Diagnosis not present

## 2019-04-26 DIAGNOSIS — Z96659 Presence of unspecified artificial knee joint: Secondary | ICD-10-CM | POA: Diagnosis not present

## 2019-04-29 DIAGNOSIS — G20 Parkinson's disease: Secondary | ICD-10-CM | POA: Diagnosis not present

## 2019-04-29 DIAGNOSIS — Z9181 History of falling: Secondary | ICD-10-CM | POA: Diagnosis not present

## 2019-04-29 DIAGNOSIS — Z96659 Presence of unspecified artificial knee joint: Secondary | ICD-10-CM | POA: Diagnosis not present

## 2019-04-29 DIAGNOSIS — F0280 Dementia in other diseases classified elsewhere without behavioral disturbance: Secondary | ICD-10-CM | POA: Diagnosis not present

## 2019-04-29 DIAGNOSIS — I10 Essential (primary) hypertension: Secondary | ICD-10-CM | POA: Diagnosis not present

## 2019-04-30 DIAGNOSIS — F0280 Dementia in other diseases classified elsewhere without behavioral disturbance: Secondary | ICD-10-CM | POA: Diagnosis not present

## 2019-04-30 DIAGNOSIS — I10 Essential (primary) hypertension: Secondary | ICD-10-CM | POA: Diagnosis not present

## 2019-04-30 DIAGNOSIS — G20 Parkinson's disease: Secondary | ICD-10-CM | POA: Diagnosis not present

## 2019-04-30 DIAGNOSIS — Z96659 Presence of unspecified artificial knee joint: Secondary | ICD-10-CM | POA: Diagnosis not present

## 2019-04-30 DIAGNOSIS — Z9181 History of falling: Secondary | ICD-10-CM | POA: Diagnosis not present

## 2019-05-04 DIAGNOSIS — R918 Other nonspecific abnormal finding of lung field: Secondary | ICD-10-CM | POA: Diagnosis not present

## 2019-05-04 DIAGNOSIS — Z9181 History of falling: Secondary | ICD-10-CM | POA: Diagnosis not present

## 2019-05-04 DIAGNOSIS — F0280 Dementia in other diseases classified elsewhere without behavioral disturbance: Secondary | ICD-10-CM | POA: Diagnosis not present

## 2019-05-04 DIAGNOSIS — Z96659 Presence of unspecified artificial knee joint: Secondary | ICD-10-CM | POA: Diagnosis not present

## 2019-05-04 DIAGNOSIS — G20 Parkinson's disease: Secondary | ICD-10-CM | POA: Diagnosis not present

## 2019-05-04 DIAGNOSIS — I10 Essential (primary) hypertension: Secondary | ICD-10-CM | POA: Diagnosis not present

## 2019-05-06 DIAGNOSIS — Z96659 Presence of unspecified artificial knee joint: Secondary | ICD-10-CM | POA: Diagnosis not present

## 2019-05-06 DIAGNOSIS — F0280 Dementia in other diseases classified elsewhere without behavioral disturbance: Secondary | ICD-10-CM | POA: Diagnosis not present

## 2019-05-06 DIAGNOSIS — Z9181 History of falling: Secondary | ICD-10-CM | POA: Diagnosis not present

## 2019-05-06 DIAGNOSIS — G20 Parkinson's disease: Secondary | ICD-10-CM | POA: Diagnosis not present

## 2019-05-06 DIAGNOSIS — I10 Essential (primary) hypertension: Secondary | ICD-10-CM | POA: Diagnosis not present

## 2019-05-07 DIAGNOSIS — I10 Essential (primary) hypertension: Secondary | ICD-10-CM | POA: Diagnosis not present

## 2019-05-07 DIAGNOSIS — Z9181 History of falling: Secondary | ICD-10-CM | POA: Diagnosis not present

## 2019-05-07 DIAGNOSIS — G20 Parkinson's disease: Secondary | ICD-10-CM | POA: Diagnosis not present

## 2019-05-07 DIAGNOSIS — Z96659 Presence of unspecified artificial knee joint: Secondary | ICD-10-CM | POA: Diagnosis not present

## 2019-05-07 DIAGNOSIS — F0280 Dementia in other diseases classified elsewhere without behavioral disturbance: Secondary | ICD-10-CM | POA: Diagnosis not present

## 2019-05-12 DIAGNOSIS — Z96659 Presence of unspecified artificial knee joint: Secondary | ICD-10-CM | POA: Diagnosis not present

## 2019-05-12 DIAGNOSIS — Z9181 History of falling: Secondary | ICD-10-CM | POA: Diagnosis not present

## 2019-05-12 DIAGNOSIS — G20 Parkinson's disease: Secondary | ICD-10-CM | POA: Diagnosis not present

## 2019-05-12 DIAGNOSIS — I10 Essential (primary) hypertension: Secondary | ICD-10-CM | POA: Diagnosis not present

## 2019-05-12 DIAGNOSIS — F0280 Dementia in other diseases classified elsewhere without behavioral disturbance: Secondary | ICD-10-CM | POA: Diagnosis not present

## 2019-05-13 DIAGNOSIS — G20 Parkinson's disease: Secondary | ICD-10-CM | POA: Diagnosis not present

## 2019-05-13 DIAGNOSIS — F0280 Dementia in other diseases classified elsewhere without behavioral disturbance: Secondary | ICD-10-CM | POA: Diagnosis not present

## 2019-05-13 DIAGNOSIS — Z9181 History of falling: Secondary | ICD-10-CM | POA: Diagnosis not present

## 2019-05-13 DIAGNOSIS — I10 Essential (primary) hypertension: Secondary | ICD-10-CM | POA: Diagnosis not present

## 2019-05-13 DIAGNOSIS — Z96659 Presence of unspecified artificial knee joint: Secondary | ICD-10-CM | POA: Diagnosis not present

## 2019-05-16 DIAGNOSIS — Z96659 Presence of unspecified artificial knee joint: Secondary | ICD-10-CM | POA: Diagnosis not present

## 2019-05-16 DIAGNOSIS — G20 Parkinson's disease: Secondary | ICD-10-CM | POA: Diagnosis not present

## 2019-05-16 DIAGNOSIS — Z9181 History of falling: Secondary | ICD-10-CM | POA: Diagnosis not present

## 2019-05-16 DIAGNOSIS — F0280 Dementia in other diseases classified elsewhere without behavioral disturbance: Secondary | ICD-10-CM | POA: Diagnosis not present

## 2019-05-16 DIAGNOSIS — I10 Essential (primary) hypertension: Secondary | ICD-10-CM | POA: Diagnosis not present

## 2019-05-17 DIAGNOSIS — Z9181 History of falling: Secondary | ICD-10-CM | POA: Diagnosis not present

## 2019-05-17 DIAGNOSIS — I10 Essential (primary) hypertension: Secondary | ICD-10-CM | POA: Diagnosis not present

## 2019-05-17 DIAGNOSIS — Z96659 Presence of unspecified artificial knee joint: Secondary | ICD-10-CM | POA: Diagnosis not present

## 2019-05-17 DIAGNOSIS — G20 Parkinson's disease: Secondary | ICD-10-CM | POA: Diagnosis not present

## 2019-05-17 DIAGNOSIS — F0280 Dementia in other diseases classified elsewhere without behavioral disturbance: Secondary | ICD-10-CM | POA: Diagnosis not present

## 2019-05-20 DIAGNOSIS — Z9181 History of falling: Secondary | ICD-10-CM | POA: Diagnosis not present

## 2019-05-20 DIAGNOSIS — G20 Parkinson's disease: Secondary | ICD-10-CM | POA: Diagnosis not present

## 2019-05-20 DIAGNOSIS — Z96659 Presence of unspecified artificial knee joint: Secondary | ICD-10-CM | POA: Diagnosis not present

## 2019-05-20 DIAGNOSIS — F0280 Dementia in other diseases classified elsewhere without behavioral disturbance: Secondary | ICD-10-CM | POA: Diagnosis not present

## 2019-05-20 DIAGNOSIS — I10 Essential (primary) hypertension: Secondary | ICD-10-CM | POA: Diagnosis not present

## 2019-05-26 DIAGNOSIS — Z9181 History of falling: Secondary | ICD-10-CM | POA: Diagnosis not present

## 2019-05-26 DIAGNOSIS — Z96659 Presence of unspecified artificial knee joint: Secondary | ICD-10-CM | POA: Diagnosis not present

## 2019-05-26 DIAGNOSIS — I10 Essential (primary) hypertension: Secondary | ICD-10-CM | POA: Diagnosis not present

## 2019-05-26 DIAGNOSIS — G20 Parkinson's disease: Secondary | ICD-10-CM | POA: Diagnosis not present

## 2019-05-26 DIAGNOSIS — F0280 Dementia in other diseases classified elsewhere without behavioral disturbance: Secondary | ICD-10-CM | POA: Diagnosis not present

## 2019-05-27 DIAGNOSIS — G20 Parkinson's disease: Secondary | ICD-10-CM | POA: Diagnosis not present

## 2019-05-27 DIAGNOSIS — Z9181 History of falling: Secondary | ICD-10-CM | POA: Diagnosis not present

## 2019-05-27 DIAGNOSIS — I10 Essential (primary) hypertension: Secondary | ICD-10-CM | POA: Diagnosis not present

## 2019-05-27 DIAGNOSIS — Z96659 Presence of unspecified artificial knee joint: Secondary | ICD-10-CM | POA: Diagnosis not present

## 2019-05-27 DIAGNOSIS — F0280 Dementia in other diseases classified elsewhere without behavioral disturbance: Secondary | ICD-10-CM | POA: Diagnosis not present

## 2019-06-03 DIAGNOSIS — G20 Parkinson's disease: Secondary | ICD-10-CM | POA: Diagnosis not present

## 2019-06-03 DIAGNOSIS — Z96659 Presence of unspecified artificial knee joint: Secondary | ICD-10-CM | POA: Diagnosis not present

## 2019-06-03 DIAGNOSIS — Z9181 History of falling: Secondary | ICD-10-CM | POA: Diagnosis not present

## 2019-06-03 DIAGNOSIS — I10 Essential (primary) hypertension: Secondary | ICD-10-CM | POA: Diagnosis not present

## 2019-06-03 DIAGNOSIS — F0280 Dementia in other diseases classified elsewhere without behavioral disturbance: Secondary | ICD-10-CM | POA: Diagnosis not present

## 2019-06-08 DIAGNOSIS — Z20828 Contact with and (suspected) exposure to other viral communicable diseases: Secondary | ICD-10-CM | POA: Diagnosis not present

## 2019-06-09 DIAGNOSIS — R110 Nausea: Secondary | ICD-10-CM | POA: Diagnosis not present

## 2019-06-09 DIAGNOSIS — F0280 Dementia in other diseases classified elsewhere without behavioral disturbance: Secondary | ICD-10-CM | POA: Diagnosis not present

## 2019-06-09 DIAGNOSIS — G20 Parkinson's disease: Secondary | ICD-10-CM | POA: Diagnosis not present

## 2019-06-09 DIAGNOSIS — I10 Essential (primary) hypertension: Secondary | ICD-10-CM | POA: Diagnosis not present

## 2019-06-10 DIAGNOSIS — Z96659 Presence of unspecified artificial knee joint: Secondary | ICD-10-CM | POA: Diagnosis not present

## 2019-06-10 DIAGNOSIS — F0280 Dementia in other diseases classified elsewhere without behavioral disturbance: Secondary | ICD-10-CM | POA: Diagnosis not present

## 2019-06-10 DIAGNOSIS — Z9181 History of falling: Secondary | ICD-10-CM | POA: Diagnosis not present

## 2019-06-10 DIAGNOSIS — I10 Essential (primary) hypertension: Secondary | ICD-10-CM | POA: Diagnosis not present

## 2019-06-10 DIAGNOSIS — G20 Parkinson's disease: Secondary | ICD-10-CM | POA: Diagnosis not present

## 2019-06-15 DIAGNOSIS — I10 Essential (primary) hypertension: Secondary | ICD-10-CM | POA: Diagnosis not present

## 2019-06-15 DIAGNOSIS — F0280 Dementia in other diseases classified elsewhere without behavioral disturbance: Secondary | ICD-10-CM | POA: Diagnosis not present

## 2019-06-15 DIAGNOSIS — G20 Parkinson's disease: Secondary | ICD-10-CM | POA: Diagnosis not present

## 2019-06-15 DIAGNOSIS — Z96659 Presence of unspecified artificial knee joint: Secondary | ICD-10-CM | POA: Diagnosis not present

## 2019-06-15 DIAGNOSIS — Z9181 History of falling: Secondary | ICD-10-CM | POA: Diagnosis not present

## 2019-06-17 DIAGNOSIS — Z96659 Presence of unspecified artificial knee joint: Secondary | ICD-10-CM | POA: Diagnosis not present

## 2019-06-17 DIAGNOSIS — F0280 Dementia in other diseases classified elsewhere without behavioral disturbance: Secondary | ICD-10-CM | POA: Diagnosis not present

## 2019-06-17 DIAGNOSIS — Z9181 History of falling: Secondary | ICD-10-CM | POA: Diagnosis not present

## 2019-06-17 DIAGNOSIS — G20 Parkinson's disease: Secondary | ICD-10-CM | POA: Diagnosis not present

## 2019-06-17 DIAGNOSIS — I10 Essential (primary) hypertension: Secondary | ICD-10-CM | POA: Diagnosis not present

## 2019-06-21 DIAGNOSIS — G20 Parkinson's disease: Secondary | ICD-10-CM | POA: Diagnosis not present

## 2019-06-21 DIAGNOSIS — Z96659 Presence of unspecified artificial knee joint: Secondary | ICD-10-CM | POA: Diagnosis not present

## 2019-06-21 DIAGNOSIS — I10 Essential (primary) hypertension: Secondary | ICD-10-CM | POA: Diagnosis not present

## 2019-06-21 DIAGNOSIS — Z9181 History of falling: Secondary | ICD-10-CM | POA: Diagnosis not present

## 2019-06-21 DIAGNOSIS — F0280 Dementia in other diseases classified elsewhere without behavioral disturbance: Secondary | ICD-10-CM | POA: Diagnosis not present

## 2019-06-23 DIAGNOSIS — F0280 Dementia in other diseases classified elsewhere without behavioral disturbance: Secondary | ICD-10-CM | POA: Diagnosis not present

## 2019-06-23 DIAGNOSIS — Z96659 Presence of unspecified artificial knee joint: Secondary | ICD-10-CM | POA: Diagnosis not present

## 2019-06-23 DIAGNOSIS — Z9181 History of falling: Secondary | ICD-10-CM | POA: Diagnosis not present

## 2019-06-23 DIAGNOSIS — G20 Parkinson's disease: Secondary | ICD-10-CM | POA: Diagnosis not present

## 2019-06-23 DIAGNOSIS — I10 Essential (primary) hypertension: Secondary | ICD-10-CM | POA: Diagnosis not present

## 2019-06-30 DIAGNOSIS — Z96659 Presence of unspecified artificial knee joint: Secondary | ICD-10-CM | POA: Diagnosis not present

## 2019-06-30 DIAGNOSIS — Z9181 History of falling: Secondary | ICD-10-CM | POA: Diagnosis not present

## 2019-06-30 DIAGNOSIS — I10 Essential (primary) hypertension: Secondary | ICD-10-CM | POA: Diagnosis not present

## 2019-06-30 DIAGNOSIS — F0280 Dementia in other diseases classified elsewhere without behavioral disturbance: Secondary | ICD-10-CM | POA: Diagnosis not present

## 2019-06-30 DIAGNOSIS — G20 Parkinson's disease: Secondary | ICD-10-CM | POA: Diagnosis not present

## 2019-07-02 DIAGNOSIS — Z96659 Presence of unspecified artificial knee joint: Secondary | ICD-10-CM | POA: Diagnosis not present

## 2019-07-02 DIAGNOSIS — I10 Essential (primary) hypertension: Secondary | ICD-10-CM | POA: Diagnosis not present

## 2019-07-02 DIAGNOSIS — Z9181 History of falling: Secondary | ICD-10-CM | POA: Diagnosis not present

## 2019-07-02 DIAGNOSIS — F0280 Dementia in other diseases classified elsewhere without behavioral disturbance: Secondary | ICD-10-CM | POA: Diagnosis not present

## 2019-07-02 DIAGNOSIS — G20 Parkinson's disease: Secondary | ICD-10-CM | POA: Diagnosis not present

## 2019-07-05 DIAGNOSIS — F0280 Dementia in other diseases classified elsewhere without behavioral disturbance: Secondary | ICD-10-CM | POA: Diagnosis not present

## 2019-07-05 DIAGNOSIS — G20 Parkinson's disease: Secondary | ICD-10-CM | POA: Diagnosis not present

## 2019-07-05 DIAGNOSIS — I10 Essential (primary) hypertension: Secondary | ICD-10-CM | POA: Diagnosis not present

## 2019-07-05 DIAGNOSIS — Z96659 Presence of unspecified artificial knee joint: Secondary | ICD-10-CM | POA: Diagnosis not present

## 2019-07-05 DIAGNOSIS — Z9181 History of falling: Secondary | ICD-10-CM | POA: Diagnosis not present

## 2019-07-06 DIAGNOSIS — F0280 Dementia in other diseases classified elsewhere without behavioral disturbance: Secondary | ICD-10-CM | POA: Diagnosis not present

## 2019-07-06 DIAGNOSIS — G20 Parkinson's disease: Secondary | ICD-10-CM | POA: Diagnosis not present

## 2019-07-06 DIAGNOSIS — I10 Essential (primary) hypertension: Secondary | ICD-10-CM | POA: Diagnosis not present

## 2019-07-06 DIAGNOSIS — Z96659 Presence of unspecified artificial knee joint: Secondary | ICD-10-CM | POA: Diagnosis not present

## 2019-07-06 DIAGNOSIS — Z9181 History of falling: Secondary | ICD-10-CM | POA: Diagnosis not present

## 2019-07-09 DIAGNOSIS — I10 Essential (primary) hypertension: Secondary | ICD-10-CM | POA: Diagnosis not present

## 2019-07-09 DIAGNOSIS — G20 Parkinson's disease: Secondary | ICD-10-CM | POA: Diagnosis not present

## 2019-07-09 DIAGNOSIS — R110 Nausea: Secondary | ICD-10-CM | POA: Diagnosis not present

## 2019-07-09 DIAGNOSIS — F0280 Dementia in other diseases classified elsewhere without behavioral disturbance: Secondary | ICD-10-CM | POA: Diagnosis not present

## 2019-07-14 DIAGNOSIS — F0280 Dementia in other diseases classified elsewhere without behavioral disturbance: Secondary | ICD-10-CM | POA: Diagnosis not present

## 2019-07-14 DIAGNOSIS — Z96659 Presence of unspecified artificial knee joint: Secondary | ICD-10-CM | POA: Diagnosis not present

## 2019-07-14 DIAGNOSIS — Z9181 History of falling: Secondary | ICD-10-CM | POA: Diagnosis not present

## 2019-07-14 DIAGNOSIS — G20 Parkinson's disease: Secondary | ICD-10-CM | POA: Diagnosis not present

## 2019-07-14 DIAGNOSIS — I10 Essential (primary) hypertension: Secondary | ICD-10-CM | POA: Diagnosis not present

## 2019-07-15 DIAGNOSIS — Z9181 History of falling: Secondary | ICD-10-CM | POA: Diagnosis not present

## 2019-07-15 DIAGNOSIS — Z96659 Presence of unspecified artificial knee joint: Secondary | ICD-10-CM | POA: Diagnosis not present

## 2019-07-15 DIAGNOSIS — F0280 Dementia in other diseases classified elsewhere without behavioral disturbance: Secondary | ICD-10-CM | POA: Diagnosis not present

## 2019-07-15 DIAGNOSIS — I10 Essential (primary) hypertension: Secondary | ICD-10-CM | POA: Diagnosis not present

## 2019-07-15 DIAGNOSIS — G20 Parkinson's disease: Secondary | ICD-10-CM | POA: Diagnosis not present

## 2019-07-21 DIAGNOSIS — Z96659 Presence of unspecified artificial knee joint: Secondary | ICD-10-CM | POA: Diagnosis not present

## 2019-07-21 DIAGNOSIS — Z9181 History of falling: Secondary | ICD-10-CM | POA: Diagnosis not present

## 2019-07-21 DIAGNOSIS — G20 Parkinson's disease: Secondary | ICD-10-CM | POA: Diagnosis not present

## 2019-07-21 DIAGNOSIS — F0280 Dementia in other diseases classified elsewhere without behavioral disturbance: Secondary | ICD-10-CM | POA: Diagnosis not present

## 2019-07-21 DIAGNOSIS — I10 Essential (primary) hypertension: Secondary | ICD-10-CM | POA: Diagnosis not present

## 2019-07-28 DIAGNOSIS — I10 Essential (primary) hypertension: Secondary | ICD-10-CM | POA: Diagnosis not present

## 2019-07-28 DIAGNOSIS — F0280 Dementia in other diseases classified elsewhere without behavioral disturbance: Secondary | ICD-10-CM | POA: Diagnosis not present

## 2019-07-28 DIAGNOSIS — Z96659 Presence of unspecified artificial knee joint: Secondary | ICD-10-CM | POA: Diagnosis not present

## 2019-07-28 DIAGNOSIS — Z9181 History of falling: Secondary | ICD-10-CM | POA: Diagnosis not present

## 2019-07-28 DIAGNOSIS — G20 Parkinson's disease: Secondary | ICD-10-CM | POA: Diagnosis not present

## 2019-07-30 DIAGNOSIS — W1789XA Other fall from one level to another, initial encounter: Secondary | ICD-10-CM | POA: Diagnosis not present

## 2019-07-30 DIAGNOSIS — I10 Essential (primary) hypertension: Secondary | ICD-10-CM | POA: Diagnosis not present

## 2019-08-02 DIAGNOSIS — Z9181 History of falling: Secondary | ICD-10-CM | POA: Diagnosis not present

## 2019-08-02 DIAGNOSIS — I10 Essential (primary) hypertension: Secondary | ICD-10-CM | POA: Diagnosis not present

## 2019-08-02 DIAGNOSIS — G20 Parkinson's disease: Secondary | ICD-10-CM | POA: Diagnosis not present

## 2019-08-02 DIAGNOSIS — F0280 Dementia in other diseases classified elsewhere without behavioral disturbance: Secondary | ICD-10-CM | POA: Diagnosis not present

## 2019-08-02 DIAGNOSIS — Z96659 Presence of unspecified artificial knee joint: Secondary | ICD-10-CM | POA: Diagnosis not present

## 2019-08-04 DIAGNOSIS — S9031XA Contusion of right foot, initial encounter: Secondary | ICD-10-CM | POA: Diagnosis not present

## 2019-08-04 DIAGNOSIS — B351 Tinea unguium: Secondary | ICD-10-CM | POA: Diagnosis not present

## 2019-08-06 DIAGNOSIS — F419 Anxiety disorder, unspecified: Secondary | ICD-10-CM | POA: Diagnosis not present

## 2019-08-06 DIAGNOSIS — F5104 Psychophysiologic insomnia: Secondary | ICD-10-CM | POA: Diagnosis not present

## 2019-08-06 DIAGNOSIS — F0280 Dementia in other diseases classified elsewhere without behavioral disturbance: Secondary | ICD-10-CM | POA: Diagnosis not present

## 2019-08-06 DIAGNOSIS — R32 Unspecified urinary incontinence: Secondary | ICD-10-CM | POA: Diagnosis not present

## 2019-08-09 DIAGNOSIS — I10 Essential (primary) hypertension: Secondary | ICD-10-CM | POA: Diagnosis not present

## 2019-08-09 DIAGNOSIS — F0280 Dementia in other diseases classified elsewhere without behavioral disturbance: Secondary | ICD-10-CM | POA: Diagnosis not present

## 2019-08-09 DIAGNOSIS — Z96659 Presence of unspecified artificial knee joint: Secondary | ICD-10-CM | POA: Diagnosis not present

## 2019-08-09 DIAGNOSIS — G20 Parkinson's disease: Secondary | ICD-10-CM | POA: Diagnosis not present

## 2019-08-09 DIAGNOSIS — Z9181 History of falling: Secondary | ICD-10-CM | POA: Diagnosis not present

## 2019-08-13 DIAGNOSIS — Z9114 Patient's other noncompliance with medication regimen: Secondary | ICD-10-CM | POA: Diagnosis not present

## 2019-08-13 DIAGNOSIS — I10 Essential (primary) hypertension: Secondary | ICD-10-CM | POA: Diagnosis not present

## 2019-08-13 DIAGNOSIS — F0280 Dementia in other diseases classified elsewhere without behavioral disturbance: Secondary | ICD-10-CM | POA: Diagnosis not present

## 2019-08-14 DIAGNOSIS — F0280 Dementia in other diseases classified elsewhere without behavioral disturbance: Secondary | ICD-10-CM | POA: Diagnosis not present

## 2019-08-14 DIAGNOSIS — I10 Essential (primary) hypertension: Secondary | ICD-10-CM | POA: Diagnosis not present

## 2019-08-14 DIAGNOSIS — G20 Parkinson's disease: Secondary | ICD-10-CM | POA: Diagnosis not present

## 2019-08-14 DIAGNOSIS — Z96659 Presence of unspecified artificial knee joint: Secondary | ICD-10-CM | POA: Diagnosis not present

## 2019-08-14 DIAGNOSIS — Z9181 History of falling: Secondary | ICD-10-CM | POA: Diagnosis not present

## 2019-08-16 DIAGNOSIS — G20 Parkinson's disease: Secondary | ICD-10-CM | POA: Diagnosis not present

## 2019-08-16 DIAGNOSIS — Z96659 Presence of unspecified artificial knee joint: Secondary | ICD-10-CM | POA: Diagnosis not present

## 2019-08-16 DIAGNOSIS — F0280 Dementia in other diseases classified elsewhere without behavioral disturbance: Secondary | ICD-10-CM | POA: Diagnosis not present

## 2019-08-16 DIAGNOSIS — I10 Essential (primary) hypertension: Secondary | ICD-10-CM | POA: Diagnosis not present

## 2019-08-16 DIAGNOSIS — Z9181 History of falling: Secondary | ICD-10-CM | POA: Diagnosis not present

## 2019-08-17 DIAGNOSIS — E0590 Thyrotoxicosis, unspecified without thyrotoxic crisis or storm: Secondary | ICD-10-CM | POA: Diagnosis not present

## 2019-08-17 DIAGNOSIS — D649 Anemia, unspecified: Secondary | ICD-10-CM | POA: Diagnosis not present

## 2019-08-17 DIAGNOSIS — E559 Vitamin D deficiency, unspecified: Secondary | ICD-10-CM | POA: Diagnosis not present

## 2019-08-17 DIAGNOSIS — I10 Essential (primary) hypertension: Secondary | ICD-10-CM | POA: Diagnosis not present

## 2019-08-17 DIAGNOSIS — E039 Hypothyroidism, unspecified: Secondary | ICD-10-CM | POA: Diagnosis not present

## 2019-08-17 DIAGNOSIS — D519 Vitamin B12 deficiency anemia, unspecified: Secondary | ICD-10-CM | POA: Diagnosis not present

## 2019-08-18 DIAGNOSIS — F0280 Dementia in other diseases classified elsewhere without behavioral disturbance: Secondary | ICD-10-CM | POA: Diagnosis not present

## 2019-08-18 DIAGNOSIS — Z96659 Presence of unspecified artificial knee joint: Secondary | ICD-10-CM | POA: Diagnosis not present

## 2019-08-18 DIAGNOSIS — I10 Essential (primary) hypertension: Secondary | ICD-10-CM | POA: Diagnosis not present

## 2019-08-18 DIAGNOSIS — G20 Parkinson's disease: Secondary | ICD-10-CM | POA: Diagnosis not present

## 2019-08-18 DIAGNOSIS — Z9181 History of falling: Secondary | ICD-10-CM | POA: Diagnosis not present

## 2019-08-20 DIAGNOSIS — F0280 Dementia in other diseases classified elsewhere without behavioral disturbance: Secondary | ICD-10-CM | POA: Diagnosis not present

## 2019-08-20 DIAGNOSIS — E568 Deficiency of other vitamins: Secondary | ICD-10-CM | POA: Diagnosis not present

## 2019-08-20 DIAGNOSIS — I10 Essential (primary) hypertension: Secondary | ICD-10-CM | POA: Diagnosis not present

## 2019-08-20 DIAGNOSIS — Z9114 Patient's other noncompliance with medication regimen: Secondary | ICD-10-CM | POA: Diagnosis not present

## 2019-08-23 DIAGNOSIS — I10 Essential (primary) hypertension: Secondary | ICD-10-CM | POA: Diagnosis not present

## 2019-08-23 DIAGNOSIS — Z96659 Presence of unspecified artificial knee joint: Secondary | ICD-10-CM | POA: Diagnosis not present

## 2019-08-23 DIAGNOSIS — Z9181 History of falling: Secondary | ICD-10-CM | POA: Diagnosis not present

## 2019-08-23 DIAGNOSIS — G20 Parkinson's disease: Secondary | ICD-10-CM | POA: Diagnosis not present

## 2019-08-23 DIAGNOSIS — F0280 Dementia in other diseases classified elsewhere without behavioral disturbance: Secondary | ICD-10-CM | POA: Diagnosis not present

## 2019-08-25 DIAGNOSIS — Z9181 History of falling: Secondary | ICD-10-CM | POA: Diagnosis not present

## 2019-08-25 DIAGNOSIS — I10 Essential (primary) hypertension: Secondary | ICD-10-CM | POA: Diagnosis not present

## 2019-08-25 DIAGNOSIS — G20 Parkinson's disease: Secondary | ICD-10-CM | POA: Diagnosis not present

## 2019-08-25 DIAGNOSIS — Z96659 Presence of unspecified artificial knee joint: Secondary | ICD-10-CM | POA: Diagnosis not present

## 2019-08-25 DIAGNOSIS — F0280 Dementia in other diseases classified elsewhere without behavioral disturbance: Secondary | ICD-10-CM | POA: Diagnosis not present

## 2019-08-30 DIAGNOSIS — Z96659 Presence of unspecified artificial knee joint: Secondary | ICD-10-CM | POA: Diagnosis not present

## 2019-08-30 DIAGNOSIS — G20 Parkinson's disease: Secondary | ICD-10-CM | POA: Diagnosis not present

## 2019-08-30 DIAGNOSIS — F0280 Dementia in other diseases classified elsewhere without behavioral disturbance: Secondary | ICD-10-CM | POA: Diagnosis not present

## 2019-08-30 DIAGNOSIS — Z9181 History of falling: Secondary | ICD-10-CM | POA: Diagnosis not present

## 2019-08-30 DIAGNOSIS — I10 Essential (primary) hypertension: Secondary | ICD-10-CM | POA: Diagnosis not present

## 2019-09-03 DIAGNOSIS — I10 Essential (primary) hypertension: Secondary | ICD-10-CM | POA: Diagnosis not present

## 2019-09-03 DIAGNOSIS — G20 Parkinson's disease: Secondary | ICD-10-CM | POA: Diagnosis not present

## 2019-09-03 DIAGNOSIS — Z9181 History of falling: Secondary | ICD-10-CM | POA: Diagnosis not present

## 2019-09-03 DIAGNOSIS — F0280 Dementia in other diseases classified elsewhere without behavioral disturbance: Secondary | ICD-10-CM | POA: Diagnosis not present

## 2019-09-03 DIAGNOSIS — Z96659 Presence of unspecified artificial knee joint: Secondary | ICD-10-CM | POA: Diagnosis not present

## 2019-09-06 DIAGNOSIS — G20 Parkinson's disease: Secondary | ICD-10-CM | POA: Diagnosis not present

## 2019-09-06 DIAGNOSIS — F0280 Dementia in other diseases classified elsewhere without behavioral disturbance: Secondary | ICD-10-CM | POA: Diagnosis not present

## 2019-09-06 DIAGNOSIS — Z9181 History of falling: Secondary | ICD-10-CM | POA: Diagnosis not present

## 2019-09-06 DIAGNOSIS — Z96659 Presence of unspecified artificial knee joint: Secondary | ICD-10-CM | POA: Diagnosis not present

## 2019-09-06 DIAGNOSIS — I10 Essential (primary) hypertension: Secondary | ICD-10-CM | POA: Diagnosis not present

## 2019-09-08 DIAGNOSIS — Z96659 Presence of unspecified artificial knee joint: Secondary | ICD-10-CM | POA: Diagnosis not present

## 2019-09-08 DIAGNOSIS — G20 Parkinson's disease: Secondary | ICD-10-CM | POA: Diagnosis not present

## 2019-09-08 DIAGNOSIS — I10 Essential (primary) hypertension: Secondary | ICD-10-CM | POA: Diagnosis not present

## 2019-09-08 DIAGNOSIS — F0280 Dementia in other diseases classified elsewhere without behavioral disturbance: Secondary | ICD-10-CM | POA: Diagnosis not present

## 2019-09-08 DIAGNOSIS — Z9181 History of falling: Secondary | ICD-10-CM | POA: Diagnosis not present

## 2019-09-10 DIAGNOSIS — K0889 Other specified disorders of teeth and supporting structures: Secondary | ICD-10-CM | POA: Diagnosis not present

## 2019-09-10 DIAGNOSIS — N39498 Other specified urinary incontinence: Secondary | ICD-10-CM | POA: Diagnosis not present

## 2019-09-10 DIAGNOSIS — I10 Essential (primary) hypertension: Secondary | ICD-10-CM | POA: Diagnosis not present

## 2019-09-24 DIAGNOSIS — F418 Other specified anxiety disorders: Secondary | ICD-10-CM | POA: Diagnosis not present

## 2019-09-24 DIAGNOSIS — F5104 Psychophysiologic insomnia: Secondary | ICD-10-CM | POA: Diagnosis not present

## 2019-09-24 DIAGNOSIS — I10 Essential (primary) hypertension: Secondary | ICD-10-CM | POA: Diagnosis not present

## 2019-10-08 DIAGNOSIS — I10 Essential (primary) hypertension: Secondary | ICD-10-CM | POA: Diagnosis not present

## 2019-10-08 DIAGNOSIS — R2681 Unsteadiness on feet: Secondary | ICD-10-CM | POA: Diagnosis not present

## 2019-10-08 DIAGNOSIS — N39498 Other specified urinary incontinence: Secondary | ICD-10-CM | POA: Diagnosis not present

## 2019-10-08 DIAGNOSIS — W1789XA Other fall from one level to another, initial encounter: Secondary | ICD-10-CM | POA: Diagnosis not present

## 2019-10-08 DIAGNOSIS — G20 Parkinson's disease: Secondary | ICD-10-CM | POA: Diagnosis not present

## 2019-10-15 DIAGNOSIS — R32 Unspecified urinary incontinence: Secondary | ICD-10-CM | POA: Diagnosis not present

## 2019-10-15 DIAGNOSIS — I10 Essential (primary) hypertension: Secondary | ICD-10-CM | POA: Diagnosis not present

## 2019-10-15 DIAGNOSIS — Z9181 History of falling: Secondary | ICD-10-CM | POA: Diagnosis not present

## 2019-10-15 DIAGNOSIS — M1991 Primary osteoarthritis, unspecified site: Secondary | ICD-10-CM | POA: Diagnosis not present

## 2019-10-15 DIAGNOSIS — G20 Parkinson's disease: Secondary | ICD-10-CM | POA: Diagnosis not present

## 2019-10-15 DIAGNOSIS — Z96659 Presence of unspecified artificial knee joint: Secondary | ICD-10-CM | POA: Diagnosis not present

## 2019-10-15 DIAGNOSIS — F0280 Dementia in other diseases classified elsewhere without behavioral disturbance: Secondary | ICD-10-CM | POA: Diagnosis not present

## 2019-10-15 DIAGNOSIS — R911 Solitary pulmonary nodule: Secondary | ICD-10-CM | POA: Diagnosis not present

## 2019-10-15 DIAGNOSIS — G8929 Other chronic pain: Secondary | ICD-10-CM | POA: Diagnosis not present

## 2019-10-19 DIAGNOSIS — S99921A Unspecified injury of right foot, initial encounter: Secondary | ICD-10-CM | POA: Diagnosis not present

## 2019-10-19 DIAGNOSIS — M25531 Pain in right wrist: Secondary | ICD-10-CM | POA: Diagnosis not present

## 2019-10-19 DIAGNOSIS — R2681 Unsteadiness on feet: Secondary | ICD-10-CM | POA: Diagnosis not present

## 2019-10-19 DIAGNOSIS — I10 Essential (primary) hypertension: Secondary | ICD-10-CM | POA: Diagnosis not present

## 2019-10-19 DIAGNOSIS — S6991XA Unspecified injury of right wrist, hand and finger(s), initial encounter: Secondary | ICD-10-CM | POA: Diagnosis not present

## 2019-10-19 DIAGNOSIS — M79671 Pain in right foot: Secondary | ICD-10-CM | POA: Diagnosis not present

## 2019-10-19 DIAGNOSIS — M1991 Primary osteoarthritis, unspecified site: Secondary | ICD-10-CM | POA: Diagnosis not present

## 2019-10-19 DIAGNOSIS — G20 Parkinson's disease: Secondary | ICD-10-CM | POA: Diagnosis not present

## 2019-10-19 DIAGNOSIS — M79641 Pain in right hand: Secondary | ICD-10-CM | POA: Diagnosis not present

## 2019-10-19 DIAGNOSIS — G8929 Other chronic pain: Secondary | ICD-10-CM | POA: Diagnosis not present

## 2019-10-19 DIAGNOSIS — N39498 Other specified urinary incontinence: Secondary | ICD-10-CM | POA: Diagnosis not present

## 2019-10-19 DIAGNOSIS — R32 Unspecified urinary incontinence: Secondary | ICD-10-CM | POA: Diagnosis not present

## 2019-10-19 DIAGNOSIS — W1789XA Other fall from one level to another, initial encounter: Secondary | ICD-10-CM | POA: Diagnosis not present

## 2019-10-19 DIAGNOSIS — F0280 Dementia in other diseases classified elsewhere without behavioral disturbance: Secondary | ICD-10-CM | POA: Diagnosis not present

## 2019-10-20 DIAGNOSIS — N390 Urinary tract infection, site not specified: Secondary | ICD-10-CM | POA: Diagnosis not present

## 2019-10-21 DIAGNOSIS — G8929 Other chronic pain: Secondary | ICD-10-CM | POA: Diagnosis not present

## 2019-10-21 DIAGNOSIS — R32 Unspecified urinary incontinence: Secondary | ICD-10-CM | POA: Diagnosis not present

## 2019-10-21 DIAGNOSIS — F0280 Dementia in other diseases classified elsewhere without behavioral disturbance: Secondary | ICD-10-CM | POA: Diagnosis not present

## 2019-10-21 DIAGNOSIS — M1991 Primary osteoarthritis, unspecified site: Secondary | ICD-10-CM | POA: Diagnosis not present

## 2019-10-21 DIAGNOSIS — I10 Essential (primary) hypertension: Secondary | ICD-10-CM | POA: Diagnosis not present

## 2019-10-21 DIAGNOSIS — G20 Parkinson's disease: Secondary | ICD-10-CM | POA: Diagnosis not present

## 2019-10-22 DIAGNOSIS — M545 Low back pain: Secondary | ICD-10-CM | POA: Diagnosis not present

## 2019-10-22 DIAGNOSIS — S99921A Unspecified injury of right foot, initial encounter: Secondary | ICD-10-CM | POA: Diagnosis not present

## 2019-10-22 DIAGNOSIS — S6991XA Unspecified injury of right wrist, hand and finger(s), initial encounter: Secondary | ICD-10-CM | POA: Diagnosis not present

## 2019-10-22 DIAGNOSIS — I10 Essential (primary) hypertension: Secondary | ICD-10-CM | POA: Diagnosis not present

## 2019-10-22 DIAGNOSIS — G20 Parkinson's disease: Secondary | ICD-10-CM | POA: Diagnosis not present

## 2019-10-25 DIAGNOSIS — I10 Essential (primary) hypertension: Secondary | ICD-10-CM | POA: Diagnosis not present

## 2019-10-25 DIAGNOSIS — M1991 Primary osteoarthritis, unspecified site: Secondary | ICD-10-CM | POA: Diagnosis not present

## 2019-10-25 DIAGNOSIS — F0280 Dementia in other diseases classified elsewhere without behavioral disturbance: Secondary | ICD-10-CM | POA: Diagnosis not present

## 2019-10-25 DIAGNOSIS — R32 Unspecified urinary incontinence: Secondary | ICD-10-CM | POA: Diagnosis not present

## 2019-10-25 DIAGNOSIS — G20 Parkinson's disease: Secondary | ICD-10-CM | POA: Diagnosis not present

## 2019-10-25 DIAGNOSIS — G8929 Other chronic pain: Secondary | ICD-10-CM | POA: Diagnosis not present

## 2019-10-27 DIAGNOSIS — G20 Parkinson's disease: Secondary | ICD-10-CM | POA: Diagnosis not present

## 2019-10-27 DIAGNOSIS — M1991 Primary osteoarthritis, unspecified site: Secondary | ICD-10-CM | POA: Diagnosis not present

## 2019-10-27 DIAGNOSIS — I10 Essential (primary) hypertension: Secondary | ICD-10-CM | POA: Diagnosis not present

## 2019-10-27 DIAGNOSIS — G8929 Other chronic pain: Secondary | ICD-10-CM | POA: Diagnosis not present

## 2019-10-27 DIAGNOSIS — F0280 Dementia in other diseases classified elsewhere without behavioral disturbance: Secondary | ICD-10-CM | POA: Diagnosis not present

## 2019-10-27 DIAGNOSIS — R32 Unspecified urinary incontinence: Secondary | ICD-10-CM | POA: Diagnosis not present

## 2019-11-01 DIAGNOSIS — M1991 Primary osteoarthritis, unspecified site: Secondary | ICD-10-CM | POA: Diagnosis not present

## 2019-11-01 DIAGNOSIS — G20 Parkinson's disease: Secondary | ICD-10-CM | POA: Diagnosis not present

## 2019-11-01 DIAGNOSIS — F0280 Dementia in other diseases classified elsewhere without behavioral disturbance: Secondary | ICD-10-CM | POA: Diagnosis not present

## 2019-11-01 DIAGNOSIS — I10 Essential (primary) hypertension: Secondary | ICD-10-CM | POA: Diagnosis not present

## 2019-11-01 DIAGNOSIS — R32 Unspecified urinary incontinence: Secondary | ICD-10-CM | POA: Diagnosis not present

## 2019-11-01 DIAGNOSIS — G8929 Other chronic pain: Secondary | ICD-10-CM | POA: Diagnosis not present

## 2019-11-02 DIAGNOSIS — F0280 Dementia in other diseases classified elsewhere without behavioral disturbance: Secondary | ICD-10-CM | POA: Diagnosis not present

## 2019-11-02 DIAGNOSIS — R443 Hallucinations, unspecified: Secondary | ICD-10-CM | POA: Diagnosis not present

## 2019-11-02 DIAGNOSIS — I10 Essential (primary) hypertension: Secondary | ICD-10-CM | POA: Diagnosis not present

## 2019-11-05 DIAGNOSIS — M1991 Primary osteoarthritis, unspecified site: Secondary | ICD-10-CM | POA: Diagnosis not present

## 2019-11-05 DIAGNOSIS — F0280 Dementia in other diseases classified elsewhere without behavioral disturbance: Secondary | ICD-10-CM | POA: Diagnosis not present

## 2019-11-05 DIAGNOSIS — G20 Parkinson's disease: Secondary | ICD-10-CM | POA: Diagnosis not present

## 2019-11-05 DIAGNOSIS — I10 Essential (primary) hypertension: Secondary | ICD-10-CM | POA: Diagnosis not present

## 2019-11-05 DIAGNOSIS — G8929 Other chronic pain: Secondary | ICD-10-CM | POA: Diagnosis not present

## 2019-11-05 DIAGNOSIS — R32 Unspecified urinary incontinence: Secondary | ICD-10-CM | POA: Diagnosis not present

## 2019-11-08 DIAGNOSIS — F0280 Dementia in other diseases classified elsewhere without behavioral disturbance: Secondary | ICD-10-CM | POA: Diagnosis not present

## 2019-11-08 DIAGNOSIS — R32 Unspecified urinary incontinence: Secondary | ICD-10-CM | POA: Diagnosis not present

## 2019-11-08 DIAGNOSIS — M1991 Primary osteoarthritis, unspecified site: Secondary | ICD-10-CM | POA: Diagnosis not present

## 2019-11-08 DIAGNOSIS — I10 Essential (primary) hypertension: Secondary | ICD-10-CM | POA: Diagnosis not present

## 2019-11-08 DIAGNOSIS — G20 Parkinson's disease: Secondary | ICD-10-CM | POA: Diagnosis not present

## 2019-11-08 DIAGNOSIS — G8929 Other chronic pain: Secondary | ICD-10-CM | POA: Diagnosis not present

## 2019-11-09 DIAGNOSIS — I10 Essential (primary) hypertension: Secondary | ICD-10-CM | POA: Diagnosis not present

## 2019-11-09 DIAGNOSIS — F5104 Psychophysiologic insomnia: Secondary | ICD-10-CM | POA: Diagnosis not present

## 2019-11-09 DIAGNOSIS — E0590 Thyrotoxicosis, unspecified without thyrotoxic crisis or storm: Secondary | ICD-10-CM | POA: Diagnosis not present

## 2019-11-09 DIAGNOSIS — D649 Anemia, unspecified: Secondary | ICD-10-CM | POA: Diagnosis not present

## 2019-11-09 DIAGNOSIS — D519 Vitamin B12 deficiency anemia, unspecified: Secondary | ICD-10-CM | POA: Diagnosis not present

## 2019-11-09 DIAGNOSIS — E705 Disorders of tryptophan metabolism: Secondary | ICD-10-CM | POA: Diagnosis not present

## 2019-11-09 DIAGNOSIS — E559 Vitamin D deficiency, unspecified: Secondary | ICD-10-CM | POA: Diagnosis not present

## 2019-11-09 DIAGNOSIS — E039 Hypothyroidism, unspecified: Secondary | ICD-10-CM | POA: Diagnosis not present

## 2019-11-10 DIAGNOSIS — I10 Essential (primary) hypertension: Secondary | ICD-10-CM | POA: Diagnosis not present

## 2019-11-10 DIAGNOSIS — M1991 Primary osteoarthritis, unspecified site: Secondary | ICD-10-CM | POA: Diagnosis not present

## 2019-11-10 DIAGNOSIS — F0280 Dementia in other diseases classified elsewhere without behavioral disturbance: Secondary | ICD-10-CM | POA: Diagnosis not present

## 2019-11-10 DIAGNOSIS — G20 Parkinson's disease: Secondary | ICD-10-CM | POA: Diagnosis not present

## 2019-11-10 DIAGNOSIS — G8929 Other chronic pain: Secondary | ICD-10-CM | POA: Diagnosis not present

## 2019-11-10 DIAGNOSIS — R32 Unspecified urinary incontinence: Secondary | ICD-10-CM | POA: Diagnosis not present

## 2019-11-14 DIAGNOSIS — Z9181 History of falling: Secondary | ICD-10-CM | POA: Diagnosis not present

## 2019-11-14 DIAGNOSIS — R32 Unspecified urinary incontinence: Secondary | ICD-10-CM | POA: Diagnosis not present

## 2019-11-14 DIAGNOSIS — F0280 Dementia in other diseases classified elsewhere without behavioral disturbance: Secondary | ICD-10-CM | POA: Diagnosis not present

## 2019-11-14 DIAGNOSIS — M1991 Primary osteoarthritis, unspecified site: Secondary | ICD-10-CM | POA: Diagnosis not present

## 2019-11-14 DIAGNOSIS — G20 Parkinson's disease: Secondary | ICD-10-CM | POA: Diagnosis not present

## 2019-11-14 DIAGNOSIS — Z96659 Presence of unspecified artificial knee joint: Secondary | ICD-10-CM | POA: Diagnosis not present

## 2019-11-14 DIAGNOSIS — G8929 Other chronic pain: Secondary | ICD-10-CM | POA: Diagnosis not present

## 2019-11-14 DIAGNOSIS — R911 Solitary pulmonary nodule: Secondary | ICD-10-CM | POA: Diagnosis not present

## 2019-11-14 DIAGNOSIS — I10 Essential (primary) hypertension: Secondary | ICD-10-CM | POA: Diagnosis not present

## 2019-11-15 DIAGNOSIS — R32 Unspecified urinary incontinence: Secondary | ICD-10-CM | POA: Diagnosis not present

## 2019-11-15 DIAGNOSIS — M1991 Primary osteoarthritis, unspecified site: Secondary | ICD-10-CM | POA: Diagnosis not present

## 2019-11-15 DIAGNOSIS — I10 Essential (primary) hypertension: Secondary | ICD-10-CM | POA: Diagnosis not present

## 2019-11-15 DIAGNOSIS — G20 Parkinson's disease: Secondary | ICD-10-CM | POA: Diagnosis not present

## 2019-11-15 DIAGNOSIS — F0280 Dementia in other diseases classified elsewhere without behavioral disturbance: Secondary | ICD-10-CM | POA: Diagnosis not present

## 2019-11-15 DIAGNOSIS — G8929 Other chronic pain: Secondary | ICD-10-CM | POA: Diagnosis not present

## 2022-02-06 DEATH — deceased
